# Patient Record
Sex: Male | Born: 1960 | Race: Black or African American | Hispanic: No | Marital: Married | Smoking: Current some day smoker
Health system: Southern US, Community
[De-identification: ages and names within clinical notes are randomized; demographics above are authoritative.]

## PROBLEM LIST (undated history)

## (undated) DIAGNOSIS — K219 Gastro-esophageal reflux disease without esophagitis: Secondary | ICD-10-CM

## (undated) DIAGNOSIS — R0600 Dyspnea, unspecified: Secondary | ICD-10-CM

## (undated) DIAGNOSIS — M109 Gout, unspecified: Secondary | ICD-10-CM

## (undated) DIAGNOSIS — E785 Hyperlipidemia, unspecified: Secondary | ICD-10-CM

## (undated) DIAGNOSIS — G629 Polyneuropathy, unspecified: Secondary | ICD-10-CM

## (undated) DIAGNOSIS — M1990 Unspecified osteoarthritis, unspecified site: Secondary | ICD-10-CM

## (undated) DIAGNOSIS — I10 Essential (primary) hypertension: Secondary | ICD-10-CM

## (undated) DIAGNOSIS — E119 Type 2 diabetes mellitus without complications: Secondary | ICD-10-CM

## (undated) DIAGNOSIS — R0989 Other specified symptoms and signs involving the circulatory and respiratory systems: Secondary | ICD-10-CM

## (undated) HISTORY — PX: NO PAST SURGERIES: SHX2092

## (undated) HISTORY — DX: Type 2 diabetes mellitus without complications: E11.9

## (undated) HISTORY — PX: COLONOSCOPY WITH PROPOFOL: SHX5780

## (undated) HISTORY — DX: Hyperlipidemia, unspecified: E78.5

## (undated) HISTORY — DX: Essential (primary) hypertension: I10

---

## 2003-12-05 ENCOUNTER — Other Ambulatory Visit: Payer: Self-pay

## 2005-02-18 ENCOUNTER — Emergency Department: Payer: Self-pay | Admitting: Emergency Medicine

## 2005-04-16 ENCOUNTER — Emergency Department: Payer: Self-pay | Admitting: Emergency Medicine

## 2005-07-09 ENCOUNTER — Emergency Department: Payer: Self-pay | Admitting: Emergency Medicine

## 2005-08-17 ENCOUNTER — Ambulatory Visit: Payer: Self-pay

## 2006-01-15 ENCOUNTER — Emergency Department: Payer: Self-pay | Admitting: Emergency Medicine

## 2006-04-29 ENCOUNTER — Emergency Department: Payer: Self-pay | Admitting: Emergency Medicine

## 2007-08-17 ENCOUNTER — Emergency Department: Payer: Self-pay | Admitting: Emergency Medicine

## 2007-10-18 ENCOUNTER — Other Ambulatory Visit: Payer: Self-pay

## 2007-10-18 ENCOUNTER — Emergency Department: Payer: Self-pay | Admitting: Emergency Medicine

## 2007-10-27 ENCOUNTER — Emergency Department: Payer: Self-pay | Admitting: Emergency Medicine

## 2008-06-01 ENCOUNTER — Emergency Department: Payer: Self-pay | Admitting: Internal Medicine

## 2008-06-26 ENCOUNTER — Emergency Department: Payer: Self-pay | Admitting: Emergency Medicine

## 2008-07-03 ENCOUNTER — Emergency Department: Payer: Self-pay | Admitting: Emergency Medicine

## 2008-12-13 ENCOUNTER — Emergency Department: Payer: Self-pay | Admitting: Unknown Physician Specialty

## 2009-01-31 ENCOUNTER — Emergency Department: Payer: Self-pay | Admitting: Internal Medicine

## 2009-02-04 ENCOUNTER — Encounter: Payer: Self-pay | Admitting: Physician Assistant

## 2009-02-12 ENCOUNTER — Encounter: Payer: Self-pay | Admitting: Physician Assistant

## 2009-11-05 ENCOUNTER — Ambulatory Visit: Payer: Self-pay | Admitting: Family Medicine

## 2009-11-11 ENCOUNTER — Emergency Department: Payer: Self-pay | Admitting: Emergency Medicine

## 2009-12-15 ENCOUNTER — Emergency Department: Payer: Self-pay | Admitting: Internal Medicine

## 2010-01-27 ENCOUNTER — Emergency Department: Payer: Self-pay | Admitting: Emergency Medicine

## 2010-03-16 ENCOUNTER — Emergency Department: Payer: Self-pay | Admitting: Emergency Medicine

## 2010-05-09 ENCOUNTER — Emergency Department: Payer: Self-pay | Admitting: Emergency Medicine

## 2011-06-08 ENCOUNTER — Emergency Department: Payer: Self-pay | Admitting: *Deleted

## 2011-06-08 LAB — COMPREHENSIVE METABOLIC PANEL
Albumin: 3.8 g/dL (ref 3.4–5.0)
Alkaline Phosphatase: 52 U/L (ref 50–136)
Anion Gap: 12 (ref 7–16)
BUN: 21 mg/dL — ABNORMAL HIGH (ref 7–18)
Glucose: 120 mg/dL — ABNORMAL HIGH (ref 65–99)
Potassium: 4.1 mmol/L (ref 3.5–5.1)
SGOT(AST): 53 U/L — ABNORMAL HIGH (ref 15–37)
SGPT (ALT): 74 U/L
Total Protein: 7.1 g/dL (ref 6.4–8.2)

## 2011-06-08 LAB — CBC WITH DIFFERENTIAL/PLATELET
Basophil %: 0.2 %
Eosinophil %: 0.6 %
HCT: 48.4 % (ref 40.0–52.0)
HGB: 16.6 g/dL (ref 13.0–18.0)
Lymphocyte #: 2.2 10*3/uL (ref 1.0–3.6)
MCH: 33.5 pg (ref 26.0–34.0)
MCV: 98 fL (ref 80–100)
Monocyte #: 1.1 10*3/uL — ABNORMAL HIGH (ref 0.0–0.7)
Monocyte %: 15.5 %
Neutrophil #: 3.6 10*3/uL (ref 1.4–6.5)
Platelet: 184 10*3/uL (ref 150–440)
RBC: 4.95 10*6/uL (ref 4.40–5.90)
WBC: 6.9 10*3/uL (ref 3.8–10.6)

## 2011-06-08 LAB — PROTIME-INR
INR: 0.9
Prothrombin Time: 12.9 secs (ref 11.5–14.7)

## 2011-06-08 LAB — TROPONIN I: Troponin-I: 0.02 ng/mL

## 2011-06-20 ENCOUNTER — Ambulatory Visit: Payer: Self-pay | Admitting: Family Medicine

## 2012-08-22 ENCOUNTER — Emergency Department: Payer: Self-pay | Admitting: Emergency Medicine

## 2012-08-22 LAB — DRUG SCREEN, URINE
Benzodiazepine, Ur Scrn: NEGATIVE (ref ?–200)
Cannabinoid 50 Ng, Ur ~~LOC~~: NEGATIVE (ref ?–50)
Cocaine Metabolite,Ur ~~LOC~~: POSITIVE (ref ?–300)
MDMA (Ecstasy)Ur Screen: NEGATIVE (ref ?–500)
Tricyclic, Ur Screen: NEGATIVE (ref ?–1000)

## 2012-08-22 LAB — CK TOTAL AND CKMB (NOT AT ARMC)
CK, Total: 922 U/L — ABNORMAL HIGH (ref 35–232)
CK-MB: 3.1 ng/mL (ref 0.5–3.6)

## 2012-08-22 LAB — CBC
HGB: 16.9 g/dL (ref 13.0–18.0)
MCH: 31.4 pg (ref 26.0–34.0)
MCHC: 33.8 g/dL (ref 32.0–36.0)
Platelet: 226 10*3/uL (ref 150–440)
RBC: 5.38 10*6/uL (ref 4.40–5.90)
RDW: 13.4 % (ref 11.5–14.5)
WBC: 10.2 10*3/uL (ref 3.8–10.6)

## 2012-08-22 LAB — ETHANOL
Ethanol %: 0.102 % — ABNORMAL HIGH (ref 0.000–0.080)
Ethanol: 102 mg/dL

## 2012-08-22 LAB — COMPREHENSIVE METABOLIC PANEL
Albumin: 4 g/dL (ref 3.4–5.0)
Alkaline Phosphatase: 68 U/L (ref 50–136)
BUN: 11 mg/dL (ref 7–18)
Bilirubin,Total: 0.4 mg/dL (ref 0.2–1.0)
Co2: 20 mmol/L — ABNORMAL LOW (ref 21–32)
Creatinine: 0.59 mg/dL — ABNORMAL LOW (ref 0.60–1.30)
EGFR (African American): >60
EGFR (Non-African Amer.): >60
Glucose: 97 mg/dL (ref 65–99)
SGOT(AST): 39 U/L — ABNORMAL HIGH (ref 15–37)
SGPT (ALT): 41 U/L (ref 12–78)
Sodium: 141 mmol/L (ref 136–145)
Total Protein: 7.6 g/dL (ref 6.4–8.2)

## 2012-08-22 LAB — LIPASE, BLOOD: Lipase: 192 U/L (ref 73–393)

## 2012-08-23 LAB — TROPONIN I: Troponin-I: <0.02 ng/mL

## 2012-11-09 ENCOUNTER — Emergency Department: Payer: Self-pay | Admitting: Internal Medicine

## 2013-10-01 ENCOUNTER — Ambulatory Visit (INDEPENDENT_AMBULATORY_CARE_PROVIDER_SITE_OTHER): Payer: PRIVATE HEALTH INSURANCE

## 2013-10-01 ENCOUNTER — Encounter: Payer: Self-pay | Admitting: Podiatry

## 2013-10-01 ENCOUNTER — Ambulatory Visit (INDEPENDENT_AMBULATORY_CARE_PROVIDER_SITE_OTHER): Payer: PRIVATE HEALTH INSURANCE | Admitting: Podiatry

## 2013-10-01 VITALS — BP 126/82 | HR 75 | Resp 16 | Ht 66.0 in | Wt 177.0 lb

## 2013-10-01 DIAGNOSIS — M778 Other enthesopathies, not elsewhere classified: Secondary | ICD-10-CM

## 2013-10-01 DIAGNOSIS — M79673 Pain in unspecified foot: Secondary | ICD-10-CM

## 2013-10-01 DIAGNOSIS — E1049 Type 1 diabetes mellitus with other diabetic neurological complication: Secondary | ICD-10-CM

## 2013-10-01 DIAGNOSIS — M7750 Other enthesopathy of unspecified foot: Secondary | ICD-10-CM

## 2013-10-01 DIAGNOSIS — M779 Enthesopathy, unspecified: Secondary | ICD-10-CM

## 2013-10-01 DIAGNOSIS — B351 Tinea unguium: Secondary | ICD-10-CM

## 2013-10-01 DIAGNOSIS — M79609 Pain in unspecified limb: Secondary | ICD-10-CM

## 2013-10-01 DIAGNOSIS — M205X9 Other deformities of toe(s) (acquired), unspecified foot: Secondary | ICD-10-CM

## 2013-10-01 DIAGNOSIS — M79603 Pain in arm, unspecified: Secondary | ICD-10-CM

## 2013-10-01 NOTE — Progress Notes (Signed)
   Subjective:    Patient ID: Dennis Rowland, male    DOB: [birth year], 53 y.o.   MRN: [id number]  HPI Comments: Both of my big toes swell. They have been swelling for the last 2.5 months. They hurt as well. They are getting worse. i cut my callus and corns off and wear good shoes.  Foot Pain Associated symptoms include abdominal pain, chest pain, chills, headaches, numbness and weakness.      Review of Systems  Constitutional: Positive for chills and unexpected weight change.  HENT: Positive for sneezing.        Sinus problems  Eyes: Positive for visual disturbance.  Respiratory: Positive for shortness of breath.   Cardiovascular: Positive for chest pain.       Calf pain when walking   Gastrointestinal: Positive for abdominal pain, diarrhea and constipation.  Endocrine: Positive for cold intolerance and heat intolerance.       Excessive thirst  Musculoskeletal:       Joint pain Back pain Difficulty walking Muscle pain  Skin: Positive for color change.  Neurological: Positive for weakness, light-headedness, numbness and headaches.  Hematological:       Slow to heal  All other systems reviewed and are negative.      Objective:   Physical Exam: I have reviewed his past history medications allergies surgeries social history and review of systems. Pulses are strongly palpable bilateral capillary fill time to digits is immediate. Neurologic sensorium is decreased per since once the monofilament to the level of the metatarsophalangeal joints bilaterally. Deep tendon reflexes are in non-elicitable. Muscle strength is normal with relatively normal osseous architecture. Orthopedic evaluation does demonstrate pain on end range of motion of the first metatarsophalangeal joint left greater than right. 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 Spurring is noted hallux left. Cutaneous evaluation demonstrates supple well  hydrated cutis no erythema edema cellulitis drainage or odor. Dry xerotic skin to the plantar aspect of the bilateral foot nails appear to be thick yellow dystrophic and clinically mycotic.        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 Pain in limb secondary to onychomycosis bilateral. Diabetic peripheral neuropathy bilateral.  Plan: After sterile Betadine skin prep I injected 2 mg of dexamethasone into the first metatarsophalangeal joint of the left foot. I debrided his nails 1 through 5 bilateral. I will followup with him in 3 months.

## 2013-10-14 ENCOUNTER — Emergency Department: Payer: Self-pay | Admitting: Emergency Medicine

## 2013-11-23 ENCOUNTER — Emergency Department: Payer: Self-pay | Admitting: Emergency Medicine

## 2013-11-23 LAB — DRUG SCREEN, URINE
Amphetamines, Ur Screen: NEGATIVE (ref ?–1000)
BENZODIAZEPINE, UR SCRN: NEGATIVE (ref ?–200)
Barbiturates, Ur Screen: NEGATIVE (ref ?–200)
CANNABINOID 50 NG, UR ~~LOC~~: NEGATIVE (ref ?–50)
Cocaine Metabolite,Ur ~~LOC~~: POSITIVE (ref ?–300)
MDMA (Ecstasy)Ur Screen: NEGATIVE (ref ?–500)
Methadone, Ur Screen: NEGATIVE (ref ?–300)
OPIATE, UR SCREEN: NEGATIVE (ref ?–300)
Phencyclidine (PCP) Ur S: NEGATIVE (ref ?–25)
Tricyclic, Ur Screen: NEGATIVE (ref ?–1000)

## 2013-11-23 LAB — URINALYSIS, COMPLETE
BACTERIA: NONE SEEN
BLOOD: NEGATIVE
Bilirubin,UR: NEGATIVE
Glucose,UR: NEGATIVE mg/dL (ref 0–75)
Ketone: NEGATIVE
Leukocyte Esterase: NEGATIVE
Nitrite: NEGATIVE
PH: 5 (ref 4.5–8.0)
Protein: NEGATIVE
RBC,UR: <1 /HPF (ref 0–5)
SPECIFIC GRAVITY: 1.006 (ref 1.003–1.030)
WBC UR: 1 /HPF (ref 0–5)

## 2013-11-23 LAB — COMPREHENSIVE METABOLIC PANEL
AST: 51 U/L — AB (ref 15–37)
Albumin: 4 g/dL (ref 3.4–5.0)
Alkaline Phosphatase: 69 U/L
Anion Gap: 11 (ref 7–16)
BILIRUBIN TOTAL: 0.8 mg/dL (ref 0.2–1.0)
BUN: 15 mg/dL (ref 7–18)
CALCIUM: 8.3 mg/dL — AB (ref 8.5–10.1)
CHLORIDE: 108 mmol/L — AB (ref 98–107)
CO2: 21 mmol/L (ref 21–32)
Creatinine: 1.03 mg/dL (ref 0.60–1.30)
EGFR (Non-African Amer.): >60
Glucose: 166 mg/dL — ABNORMAL HIGH (ref 65–99)
OSMOLALITY: 284 (ref 275–301)
POTASSIUM: 3.9 mmol/L (ref 3.5–5.1)
SGPT (ALT): 78 U/L (ref 12–78)
SODIUM: 140 mmol/L (ref 136–145)
Total Protein: 8 g/dL (ref 6.4–8.2)

## 2013-11-23 LAB — CBC
HCT: 50.6 % (ref 40.0–52.0)
HGB: 17.5 g/dL (ref 13.0–18.0)
MCH: 33.7 pg (ref 26.0–34.0)
MCHC: 34.6 g/dL (ref 32.0–36.0)
MCV: 98 fL (ref 80–100)
Platelet: 178 10*3/uL (ref 150–440)
RBC: 5.19 10*6/uL (ref 4.40–5.90)
RDW: 13.2 % (ref 11.5–14.5)
WBC: 6.6 10*3/uL (ref 3.8–10.6)

## 2013-11-23 LAB — ETHANOL
ETHANOL LVL: 296 mg/dL
Ethanol %: 0.296 % — ABNORMAL HIGH (ref 0.000–0.080)

## 2013-11-23 LAB — SALICYLATE LEVEL: Salicylates, Serum: 3.6 mg/dL — ABNORMAL HIGH

## 2013-11-23 LAB — ACETAMINOPHEN LEVEL: Acetaminophen: <2 ug/mL

## 2013-12-12 ENCOUNTER — Ambulatory Visit: Payer: PRIVATE HEALTH INSURANCE | Admitting: Podiatry

## 2013-12-31 ENCOUNTER — Ambulatory Visit: Payer: PRIVATE HEALTH INSURANCE | Admitting: Podiatry

## 2014-09-05 NOTE — Consult Note (Signed)
PATIENT NAME:  Dennis Rowland, Dennis Rowland MR#:  [id number] DATE OF BIRTH:  11/14/1960  DATE OF CONSULTATION:  11/23/2013  REFERRING PHYSICIAN:   CONSULTING PHYSICIAN:  Gonzella Lex, MD  IDENTIFYING INFORMATION AND REASON FOR CONSULT: A 54 year old man petitioned by his wife for violent behavior while intoxicated. Consultation for psychiatric evaluation.   HISTORY OF PRESENT ILLNESS: Information obtained from the patient, from the chart and from speaking with his wife. Wife reports that he went out and got drunk last night, came home violent and aggressive with smashing bottles around the house, out of control, would not stop when she talked with him. Law had to be called eventually and they said that she had petitioned him to bring him into the hospital. The patient says that he was just drinking and maybe he was a little irritable but he will not elaborate on any of his behavior. Admits that he had had some alcohol last night and admits he had had some cocaine. Says that this is not a regular thing, does not really see it as being a problem. Wife says that it has been quite a while since he last got drunk to the point that he was dangerous but he still drinks on almost daily basis. She thinks that he has some kind of "mental problem" because he is memory is poor, but she does not know anything more about it than that and he is not getting any psychiatric treatment.   PAST PSYCHIATRIC HISTORY: She thinks that he had had hospitalizations years ago as a teenager. He is not getting any psychiatric treatment for anything now and is not on any psychiatric medicine. Denies any history of suicidal or homicidal history.   FAMILY HISTORY: Not identified.   SOCIAL HISTORY: Lives with his wife. 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   PAST MEDICAL HISTORY: Has diabetes, also has gout.   CURRENT MEDICATIONS: Insulin Levemir 33 units at bedtime, also metformin 500 mg twice a  day and colchicine 0.6 mg once a day.   ALLERGIES: IV DYE, BEE STINGS.   REVIEW OF SYSTEMS: The patient has no current complaints, full review of systems negative.   MENTAL STATUS EXAMINATION: He is awake and alert, passively interactive. Eye contact minimal. Psychomotor activity minimal. Speech decreased in total amount. Affect flat, a little bit grumpy but not hostile. Mood stated as being ready to go. Thoughts are not bizarre. No obvious delusions but decreased in total amount. No hallucinations evident. Denies suicidal or homicidal ideation. Judgment and insight reasonably impaired. Short-term memory impaired about his substance abuse.   VITAL SIGNS: Temperature 97.5, pulse 79, respirations 16, blood pressure 113/92.   LABORATORY, DIAGNOSTIC AND RADIOLOGICAL DATA:  Drug screen is positive for cocaine. Chemistry showed glucose elevated at 166, chloride elevated at 108, AST elevated at 51. Alcohol level 296 when he came in this morning. CBC was all normal. Urinalysis unremarkable. Elevated salicylates, not toxic though at 30.6.   ASSESSMENT:  This is a 54 year old man who has ongoing problem with drinking and got drunk to the point of being violent and aggressive and threatening. He was still somewhat hostile when he was interviewed earlier today but now has started to calm down. It is not clear that he needs inpatient hospitalization but still appears to be too intoxicated to go home.   TREATMENT PLAN: Detox orders in place. Continue medical medications. Re-evaluate tomorrow. If the patient has calmed down and is not aggressive  or actively psychotic, we will discuss substance abuse treatment with him.   DIAGNOSIS, PRINCIPAL AND PRIMARY:  AXIS I: Substance-induced mood disorder.   SECONDARY DIAGNOSES: AXIS I: Alcohol dependence.  AXIS II: Deferred.  AXIS III: Diabetes, gout.  AXIS IV: Moderate chronic stress from disability.  AXIS V: Functioning at time of evaluation is 54.     ____________________________ Gonzella Lex, MD jtc:cs D: 11/23/2013 17:11:55 ET T: 11/23/2013 18:03:15 ET JOB#: [id number]  cc: Gonzella Lex, MD, <Dictator> Gonzella Lex MD ELECTRONICALLY SIGNED 11/30/2013 12:40

## 2014-09-05 NOTE — Consult Note (Signed)
Psychiatry: Follow-up note for this patient who presented highly intoxicated and belligerent last night.  Today he is sober and has been cooperative and pleasant.  Not showing any signs of withdrawal.  Shows good insight into the problems associated with binge drinking.  Denies suicidal or homicidal ideation.  Wife is agreeable to having him come home.  Both agree that this is a fairly infrequent occurrence. mental status he is alert and oriented and cooperative.  Affect slightly blunted mood fine.  Thoughts lucid no loosening of associations no delusions.  Alert and oriented 4.  Judgment and insight improved.  Normal intelligence and short and long-term memory intact. and treatment plan: Alcohol abuse.  Cocaine abuse.  Of patient will be discharged home to follow up with his usual outpatient care.  Counseling and therapy done.  Emergency room physician agreeable.  Electronic Signatures: Gonzella Lex (MD)  (Signed on 13-Jul-15 22:27)  Authored  Last Updated: 13-Jul-15 22:27 by Gonzella Lex (MD)

## 2015-04-15 ENCOUNTER — Emergency Department
Admission: EM | Admit: 2015-04-15 | Discharge: 2015-04-15 | Disposition: A | Discharge status: Home / Self Care | Payer: Medicare Other | Attending: Emergency Medicine | Admitting: Emergency Medicine

## 2015-04-15 ENCOUNTER — Encounter: Payer: Self-pay | Admitting: *Deleted

## 2015-04-15 DIAGNOSIS — Y9389 Activity, other specified: Secondary | ICD-10-CM | POA: Insufficient documentation

## 2015-04-15 DIAGNOSIS — Z79899 Other long term (current) drug therapy: Secondary | ICD-10-CM | POA: Diagnosis not present

## 2015-04-15 DIAGNOSIS — F17200 Nicotine dependence, unspecified, uncomplicated: Secondary | ICD-10-CM | POA: Diagnosis not present

## 2015-04-15 DIAGNOSIS — Y9289 Other specified places as the place of occurrence of the external cause: Secondary | ICD-10-CM | POA: Diagnosis not present

## 2015-04-15 DIAGNOSIS — Z7984 Long term (current) use of oral hypoglycemic drugs: Secondary | ICD-10-CM | POA: Insufficient documentation

## 2015-04-15 DIAGNOSIS — E119 Type 2 diabetes mellitus without complications: Secondary | ICD-10-CM | POA: Insufficient documentation

## 2015-04-15 DIAGNOSIS — S3121XA Laceration without foreign body of penis, initial encounter: Secondary | ICD-10-CM | POA: Diagnosis not present

## 2015-04-15 DIAGNOSIS — Y998 Other external cause status: Secondary | ICD-10-CM | POA: Insufficient documentation

## 2015-04-15 DIAGNOSIS — Z791 Long term (current) use of non-steroidal anti-inflammatories (NSAID): Secondary | ICD-10-CM | POA: Diagnosis not present

## 2015-04-15 DIAGNOSIS — S3994XA Unspecified injury of external genitals, initial encounter: Secondary | ICD-10-CM | POA: Diagnosis present

## 2015-04-15 DIAGNOSIS — X58XXXA Exposure to other specified factors, initial encounter: Secondary | ICD-10-CM | POA: Diagnosis not present

## 2015-04-15 MED ORDER — MUPIROCIN 2 % EX OINT: TOPICAL_OINTMENT | CUTANEOUS | Status: AC

## 2015-04-15 NOTE — ED Notes (Signed)
Not circumsied , has tear on foreskin past week

## 2015-04-15 NOTE — ED Provider Notes (Signed)
[facility] Emergency Department Provider Note  ____________________________________________  Time seen: Approximately 11:55 AM  I have reviewed the triage vital signs and the nursing notes.   HISTORY  Chief Complaint Penis Injury    HPI Dennis Rowland. is a 54 y.o. male who presents for evaluation of a minor superficial tear of his foreskin times one week. Patient unsure of etiology is nose is a skin is been tight lately and started to tear. Denies any pain associated with this. No known trauma. Denies any penile discharge at this time.  Past Medical History  Diagnosis Date  . Diabetes ([place])     There are no active problems to display for this patient.   History reviewed. No pertinent past surgical history.  Current Outpatient Rx  Name  Route  Sig  Dispense  Refill  . ACCU-CHEK SOFTCLIX LANCETS lancets               . colchicine 0.6 MG tablet               . indomethacin (INDOCIN) 50 MG capsule               . LEVEMIR FLEXTOUCH 100 UNIT/ML Pen               . meloxicam (MOBIC) 15 MG tablet               . metFORMIN (GLUCOPHAGE) 500 MG tablet               . mupirocin ointment (BACTROBAN) 2 %      Apply to affected area 3 times daily   22 g   0   . pantoprazole (PROTONIX) 40 MG tablet               . traMADol (ULTRAM) 50 MG tablet                 Allergies Dye fdc red  No family history on file.  Social History Social History  Substance Use Topics  . Smoking status: Current Every Day Smoker  . Smokeless tobacco: None  . Alcohol Use: Yes    Review of Systems Constitutional: No fever/chills Eyes: No visual changes. ENT: No sore throat. Cardiovascular: Denies chest pain. Respiratory: Denies shortness of breath. Gastrointestinal: No abdominal pain.  No nausea, no vomiting.  No diarrhea.  No constipation. Genitourinary: Positive for superficial lacerations to the foreskin and  penis. Musculoskeletal: Negative for back pain. Skin: Negative for rash. Neurological: Negative for headaches, focal weakness or numbness.  10-point ROS otherwise negative.  ____________________________________________   PHYSICAL EXAM:  VITAL SIGNS: ED Triage Vitals  Enc Vitals Group     BP 04/15/15 1117 133/77 mmHg     Pulse Rate 04/15/15 1117 98     Resp 04/15/15 1117 20     Temp 04/15/15 1117 98.3 F (36.8 C)     Temp Source 04/15/15 1117 Oral     SpO2 04/15/15 1117 98 %     Weight 04/15/15 1117 165 lb (74.844 kg)     Height 04/15/15 1117 5\' 6"  (1.676 m)     Head Cir --      Peak Flow --      Pain Score 04/15/15 1118 0     Pain Loc --      Pain Edu? --      Excl. in [place]? --     Constitutional: Alert and oriented. Well appearing and in no acute  distress. Cardiovascular: Normal rate, regular rhythm. Grossly normal heart sounds.  Good peripheral circulation. Respiratory: Normal respiratory effort.  No retractions. Lungs CTAB. Genitourinary: Superficial lacerations approximately 2 mm on foreskin and dorsal aspect of the penis. Uncircumcised male. Neurologic:  Normal speech and language. No gross focal neurologic deficits are appreciated. No gait instability. Skin:  Skin is warm, dry and intact. No rash noted. Psychiatric: Mood and affect are normal. Speech and behavior are normal.  ____________________________________________   LABS (all labs ordered are listed, but only abnormal results are displayed)  Labs Reviewed - No data to display ____________________________________________   PROCEDURES  Procedure(s) performed: None  Critical Care performed: No  ____________________________________________   INITIAL IMPRESSION / ASSESSMENT AND PLAN / ED COURSE  Pertinent labs & imaging results that were available during my care of the patient were reviewed by me and considered in my medical decision making (see chart for details).  Superficial foreskin penile  lacerations. Rx given for mupirocin ointment 3 times a day patient to follow up with urology on-call. Patient voices no other emergency medical complaints at this time. ____________________________________________   FINAL CLINICAL IMPRESSION(S) / ED DIAGNOSES  Final diagnoses:  Penile laceration, initial encounter      Arlyss Repress, PA-C 04/15/15 1206  Wandra Arthurs, MD 04/15/15 [phone]

## 2015-04-15 NOTE — Discharge Instructions (Signed)
Wound Care °Taking care of your wound properly can help to prevent pain and infection. It can also help your wound to heal more quickly.  °HOW TO CARE FOR YOUR WOUND  °· Take or apply over-the-counter and prescription medicines only as told by your health care provider. °· If you were prescribed antibiotic medicine, take or apply it as told by your health care provider. Do not stop using the antibiotic even if your condition improves. °· Clean the wound each day or as told by your health care provider. °¨ Wash the wound with mild soap and water. °¨ Rinse the wound with water to remove all soap. °¨ Pat the wound dry with a clean towel. Do not rub it. °· There are many different ways to close and cover a wound. For example, a wound can be covered with stitches (sutures), skin glue, or adhesive strips. Follow instructions from your health care provider about: °¨ How to take care of your wound. °¨ When and how you should change your bandage (dressing). °¨ When you should remove your dressing. °¨ Removing whatever was used to close your wound. °· Check your wound every day for signs of infection. Watch for: °¨ Redness, swelling, or pain. °¨ Fluid, blood, or pus. °· Keep the dressing dry until your health care provider says it can be removed. Do not take baths, swim, use a hot tub, or do anything that would put your wound underwater until your health care provider approves. °· Raise (elevate) the injured area above the level of your heart while you are sitting or lying down. °· Do not scratch or pick at the wound. °· Keep all follow-up visits as told by your health care provider. This is important. °SEEK MEDICAL CARE IF: °· You received a tetanus shot and you have swelling, severe pain, redness, or bleeding at the injection site. °· You have a fever. °· Your pain is not controlled with medicine. °· You have increased redness, swelling, or pain at the site of your wound. °· You have fluid, blood, or pus coming from your  wound. °· You notice a bad smell coming from your wound or your dressing. °SEEK IMMEDIATE MEDICAL CARE IF: °· You have a red streak going away from your wound. °  °This information is not intended to replace advice given to you by your health care provider. Make sure you discuss any questions you have with your health care provider. °  °Document Released: 02/08/2008 Document Revised: 09/15/2014 Document Reviewed: 04/27/2014 °Elsevier Interactive Patient Education ©2016 [employer] ° °

## 2017-12-10 ENCOUNTER — Ambulatory Visit: Payer: Medicaid Other

## 2017-12-10 ENCOUNTER — Encounter: Payer: Self-pay | Admitting: Podiatry

## 2017-12-10 ENCOUNTER — Encounter

## 2017-12-10 ENCOUNTER — Ambulatory Visit (INDEPENDENT_AMBULATORY_CARE_PROVIDER_SITE_OTHER): Payer: 59 | Admitting: Podiatry

## 2017-12-10 VITALS — BP 119/76 | HR 82 | Resp 16

## 2017-12-10 DIAGNOSIS — M79676 Pain in unspecified toe(s): Secondary | ICD-10-CM

## 2017-12-10 DIAGNOSIS — E1142 Type 2 diabetes mellitus with diabetic polyneuropathy: Secondary | ICD-10-CM | POA: Diagnosis not present

## 2017-12-10 DIAGNOSIS — G5791 Unspecified mononeuropathy of right lower limb: Secondary | ICD-10-CM | POA: Diagnosis not present

## 2017-12-10 DIAGNOSIS — M2042 Other hammer toe(s) (acquired), left foot: Secondary | ICD-10-CM

## 2017-12-10 DIAGNOSIS — Q828 Other specified congenital malformations of skin: Secondary | ICD-10-CM | POA: Diagnosis not present

## 2017-12-10 DIAGNOSIS — B351 Tinea unguium: Secondary | ICD-10-CM

## 2017-12-10 DIAGNOSIS — M2041 Other hammer toe(s) (acquired), right foot: Secondary | ICD-10-CM

## 2017-12-10 NOTE — Progress Notes (Signed)
He presents today chief complaint of painful radiating pain from the plantar medial aspect of the first metatarsophalangeal joint of the right foot radiating up into the toe.  Is also complaining of painful elongated toenails numbness and tingling in his feet as well as corns and calluses bilaterally.  Objective: Vital signs are stable he is alert and oriented x3.  Pulses are palpable has digital deformities consisting of bunion deformities hammertoe deformities as well as painful neuritis on palpation.  Toenails are long thick yellow dystrophic onychomycotic pre-ulcerative calluses plantar aspect of the forefoot bilaterally.  Was debrided does not demonstrate any type of open lesion or wound.  Assessment: Pain in limb secondary to diabetes diabetic peripheral neuropathy digital deformities pain in limb secondary to onychomycosis neuritis and porokeratosis.  Plan: After sterile Betadine skin prep I injected 2 mg of dexamethasone local anesthetic plantar medial aspect of the first metatarsophalangeal joint of the right foot debrided all reactive hyperkeratotic lesions to toenails 1 through 5 bilateral.  We will also start paperwork for diabetic shoes.

## 2017-12-19 ENCOUNTER — Ambulatory Visit: Payer: 59 | Admitting: Orthotics

## 2017-12-19 DIAGNOSIS — E1142 Type 2 diabetes mellitus with diabetic polyneuropathy: Secondary | ICD-10-CM

## 2017-12-19 NOTE — Progress Notes (Signed)
Patient being seen by NP at [facility].  Told him to come back after he is seen by a MD/DO.

## 2018-02-15 ENCOUNTER — Other Ambulatory Visit: Payer: Self-pay | Admitting: Nurse Practitioner

## 2018-02-15 DIAGNOSIS — R1909 Other intra-abdominal and pelvic swelling, mass and lump: Secondary | ICD-10-CM

## 2018-02-26 ENCOUNTER — Ambulatory Visit: Payer: 59

## 2018-03-05 ENCOUNTER — Encounter: Payer: Self-pay | Admitting: Nurse Practitioner

## 2018-03-05 ENCOUNTER — Ambulatory Visit
Admission: RE | Admit: 2018-03-05 | Discharge: 2018-03-05 | Disposition: A | Discharge status: Home / Self Care | Payer: 59 | Source: Ambulatory Visit | Attending: Nurse Practitioner | Admitting: Nurse Practitioner

## 2018-03-05 DIAGNOSIS — R1909 Other intra-abdominal and pelvic swelling, mass and lump: Secondary | ICD-10-CM | POA: Insufficient documentation

## 2018-03-05 DIAGNOSIS — K4090 Unilateral inguinal hernia, without obstruction or gangrene, not specified as recurrent: Secondary | ICD-10-CM | POA: Diagnosis not present

## 2018-03-06 ENCOUNTER — Ambulatory Visit: Payer: 59 | Admitting: Orthotics

## 2018-03-06 DIAGNOSIS — E1142 Type 2 diabetes mellitus with diabetic polyneuropathy: Secondary | ICD-10-CM

## 2018-03-06 DIAGNOSIS — Q828 Other specified congenital malformations of skin: Secondary | ICD-10-CM

## 2018-03-06 DIAGNOSIS — G5791 Unspecified mononeuropathy of right lower limb: Secondary | ICD-10-CM

## 2018-03-06 NOTE — Progress Notes (Signed)

## 2018-03-25 ENCOUNTER — Ambulatory Visit (INDEPENDENT_AMBULATORY_CARE_PROVIDER_SITE_OTHER): Payer: 59 | Admitting: Podiatry

## 2018-03-25 ENCOUNTER — Encounter

## 2018-03-25 ENCOUNTER — Encounter: Payer: Self-pay | Admitting: Podiatry

## 2018-03-25 DIAGNOSIS — Q828 Other specified congenital malformations of skin: Secondary | ICD-10-CM

## 2018-03-25 DIAGNOSIS — M79676 Pain in unspecified toe(s): Secondary | ICD-10-CM

## 2018-03-25 DIAGNOSIS — B351 Tinea unguium: Secondary | ICD-10-CM

## 2018-03-25 DIAGNOSIS — E1142 Type 2 diabetes mellitus with diabetic polyneuropathy: Secondary | ICD-10-CM

## 2018-03-25 DIAGNOSIS — M205X1 Other deformities of toe(s) (acquired), right foot: Secondary | ICD-10-CM

## 2018-03-25 DIAGNOSIS — L84 Corns and callosities: Secondary | ICD-10-CM | POA: Diagnosis not present

## 2018-03-25 MED ORDER — MELOXICAM 15 MG PO TABS: 15.0000 mg | ORAL_TABLET | Freq: Every day | ORAL | 1 refills | Status: DC

## 2018-03-25 NOTE — Progress Notes (Signed)
This patient presents to the office for continued treatment of his long thick nails and his painful calluses.  Patient states that the nails are painful walking and wearing his shoes.  He also says the callus under the outside  ball of his right foot is painful walking.  He states he is also having severe pain in his big toe joint right foot that he is unable to walk with pressure on his right foot.  Review of his history does reveal he has a history of arthritis for which he has been treated with colchicine and Indocin.  He has been seen years ago by Dr. 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  He states he is unable to bear weight and even touch the big toe joint today.  He also presents the office stating that he is due to receive diabetic shoes.  Finally he says he is not interested in any type of injection since the injection was so painful his last visit.  He presents the office today for preventative foot care services and evaluation of the big toe joint right foot  General Appearance  Alert, conversant and in no acute stress.  Vascular  Dorsalis pedis and posterior tibial  pulses are palpable  bilaterally.  Capillary return is within normal limits  bilaterally. Temperature is within normal limits  bilaterally.  Neurologic  Senn-Weinstein monofilament wire test within normal limits  bilaterally. Muscle power within normal limits bilaterally.  Nails Thick disfigured discolored nails with subungual debris  from hallux to fifth toes bilaterally. No evidence of bacterial infection or drainage bilaterally.  Orthopedic  No limitations of motion  feet .  No crepitus or effusions noted.  HAV deformity left foot. Severe pain upon pressure big toe joint and motion in big toe joint right foot.  Skin  Pinch callus hallux  B/L and heel callus  B/L and callus sub 5th metatarsal right foot.  No signs of infections or ulcers noted.    Onychomycosis   Callus  B/L  Debridement of nails  X  10.  Debridement of callus  B/L.  Patient was instructed to wear a surgical shoe in an effort to limit the motion in the big toe joint right foot.  He was prescribed Mobic to be taken 1 daily.  He was told to make an appointment with Dr. Milinda Pointer as he leaves today for a work-up for his severe painful big toe joint right foot.  He is to return to the office in 3 months for preventative foot care services   Gardiner Barefoot DPM

## 2018-03-28 ENCOUNTER — Other Ambulatory Visit: Payer: Self-pay

## 2018-03-28 ENCOUNTER — Ambulatory Visit: Payer: 59 | Admitting: Surgery

## 2018-03-28 ENCOUNTER — Encounter: Payer: Self-pay | Admitting: Surgery

## 2018-03-28 ENCOUNTER — Ambulatory Visit (INDEPENDENT_AMBULATORY_CARE_PROVIDER_SITE_OTHER): Payer: 59 | Admitting: Surgery

## 2018-03-28 VITALS — BP 143/83 | HR 73 | Temp 97.9°F | Resp 13 | Ht 66.0 in | Wt 150.0 lb

## 2018-03-28 DIAGNOSIS — K4020 Bilateral inguinal hernia, without obstruction or gangrene, not specified as recurrent: Secondary | ICD-10-CM | POA: Diagnosis not present

## 2018-03-28 NOTE — Progress Notes (Signed)
Surgical Clinic History and Physical  Referring provider:  Center, [facility] [place], [place] [address]  HISTORY OF PRESENT ILLNESS (HPI):  57 y.o. male presents for evaluation of painful Left groin bulge. 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 Over the past month, patient describes he has noticed a Left groin bulge with lifting or certain movements side-to-side. He says his "bulge" usually reduces spontaneously within "a few days at most", though he says he has also manually self-reduced his Left groin bulge "a few times" as well. He otherwise denies non-reducible Left groin bulge, frequent constipation, straining with urination, abdominal distention, N/V, fever/chills, CP, or SOB. He coughs frequently, attributed to chronic ongoing tobacco abuse (smoking), though says he previously quit smoking x 2 years. He denies any CP or SOB at rest or with activities. He is currently scheduled for multiple tooth extractions and has an appointment to evaluate for possible Right foot surgery, which he attributes to gout. He states he wants to have his Left inguinal hernia fixed after his upcoming dental and Right foot surgeries.  PAST MEDICAL HISTORY (PMH):  Past Medical History:  Diagnosis Date  . Diabetes ([place])   . Hyperlipidemia   . Hypertension     PAST SURGICAL HISTORY ([place]):  History reviewed. No pertinent surgical history.   MEDICATIONS:  Prior to Admission medications   Medication Sig Start Date End Date Taking? Authorizing Provider  ACCU-CHEK SOFTCLIX LANCETS lancets  07/12/13  Yes [provider]  aspirin EC 81 MG tablet 81 mg 03/02/15  Yes [provider]  cetirizine (ZYRTEC) 10 MG tablet Take 10 mg by mouth.   Yes  [provider]  colchicine 0.6 MG tablet  09/02/13  Yes [provider]  gabapentin (NEURONTIN) 300 MG capsule Take 300 mg by mouth. 12/19/12  Yes [provider]  indomethacin (INDOCIN) 50 MG capsule  07/18/13  Yes [provider]  Insulin Detemir (LEVEMIR FLEXTOUCH) 100 UNIT/ML Pen  09/16/13  Yes [provider]  lisinopril (PRINIVIL,ZESTRIL) 5 MG tablet 5 mg 03/02/15  Yes [provider]  meloxicam (MOBIC) 15 MG tablet Take 1 tablet (15 mg total) by mouth daily. 03/25/18  Yes Gardiner Barefoot, DPM  metFORMIN (GLUCOPHAGE) 500 MG tablet  08/28/13  Yes [provider]  pantoprazole (PROTONIX) 40 MG tablet  08/28/13  Yes [provider]  sildenafil (VIAGRA) 100 MG tablet 100 mg 02/19/13  Yes [provider]  simvastatin (ZOCOR) 20 MG tablet 20 mg 03/02/15  Yes [provider]  traMADol (ULTRAM) 50 MG tablet  08/04/13  Yes [provider]  chlorhexidine (PERIDEX) 0.12 % solution See admin instructions. 03/13/18   [provider]     ALLERGIES:  Allergies  Allergen Reactions  . Tape Anaphylaxis  . Dye Fdc Red [Red Dye] Nausea And Vomiting  . Hydrochlorothiazide W-Triamterene     Other reaction(s): Unknown     SOCIAL HISTORY:  Social History   Socioeconomic History  . Marital status: Married    Spouse name: Not on file  . Number of children: Not on file  . Years of education: Not on file  . Highest education level: Not on file  Occupational History  . Not on file  Social Needs  . Emergency planning/management officer  strain: Not on file  . Food insecurity:    Worry: Not on file    Inability: Not on file  . Transportation needs:    Medical: Not on file    Non-medical: Not on file  Tobacco Use  . Smoking status: Current Every Day Smoker  . Smokeless tobacco: Never Used  Substance and Sexual Activity  . Alcohol use: Yes  . Drug use: Not Currently    Types: Cocaine  . Sexual activity: Not on file   Lifestyle  . Physical activity:    Days per week: Not on file    Minutes per session: Not on file  . Stress: Not on file  Relationships  . Social connections:    Talks on phone: Not on file    Gets together: Not on file    Attends religious service: Not on file    Active member of club or organization: Not on file    Attends meetings of clubs or organizations: Not on file    Relationship status: Not on file  . Intimate partner violence:    Fear of current or ex partner: Not on file    Emotionally abused: Not on file    Physically abused: Not on file    Forced sexual activity: Not on file  Other Topics Concern  . Not on file  Social History Narrative  . Not on file    The patient currently resides (home / rehab facility / nursing home): Home The patient normally is (ambulatory / bedbound): Ambulatory  FAMILY HISTORY:  History reviewed. No pertinent family history.  Otherwise negative/non-contributory.  REVIEW OF SYSTEMS:  Constitutional: denies any other weight loss, fever, chills, or sweats  Eyes: denies any other vision changes, history of eye injury  ENT: denies sore throat, hearing problems  Respiratory: denies shortness of breath, wheezing  Cardiovascular: denies chest pain, palpitations  Gastrointestinal: abdominal pain, N/V, and bowel function as per HPI Musculoskeletal: denies any other joint pains or cramps  Skin: Denies any other rashes or skin discolorations except as per HPI Neurological: denies any other headache, dizziness, weakness  Psychiatric: Denies any other depression, anxiety   All other review of systems were otherwise negative   VITAL SIGNS:  BP (!) 143/83   Pulse 73   Temp 97.9 F (36.6 C) (Skin)   Resp 13   Ht 5\' 6"  (1.676 m)   Wt 150 lb (68 kg)   BMI 24.21 kg/m   PHYSICAL EXAM:  Constitutional:  -- Normal body habitus  -- Awake, alert, and oriented x3  Eyes:  -- Pupils equally round and reactive to light  -- No scleral icterus   Ear, nose, throat:  -- No jugular venous distension -- No nasal drainage, bleeding Pulmonary:  -- No crackles  -- Equal breath sounds bilaterally -- Breathing non-labored at rest Cardiovascular:  -- S1, S2 present  -- No pericardial rubs  Gastrointestinal:  -- Abdomen soft, nontender, non-distended, no guarding/rebound -- Left groin tender to palpation easily reducible Left inguinal hernia -- Non-tender to palpation easily reducible Right inguinal hernia -- No other abdominal masses appreciated, pulsatile or otherwise  Musculoskeletal and Integumentary:  -- Wounds or skin discoloration: Right foot with podiatric boot and dressing -- Extremities: B/L UE and LE FROM, hands and feet warm, no edema  Neurologic:  -- Motor function: Intact and symmetric -- Sensation: Intact and symmetric  Labs:  CBC Latest Ref Rng & Units 11/23/2013 08/22/2012 06/08/2011  WBC 3.8 - 10.6 x10 3/mm  3 6.6 10.2 6.9  Hemoglobin 13.0 - 18.0 g/dL 17.5 16.9 16.6  Hematocrit 40.0 - 52.0 % 50.6 49.9 48.4  Platelets 150 - 440 x10 3/mm 3 178 226 184   CMP Latest Ref Rng & Units 11/23/2013 08/22/2012 06/08/2011  Glucose 65 - 99 mg/dL 166(H) 97 120(H)  BUN 7 - 18 mg/dL 15 11 21(H)  Creatinine 0.60 - 1.30 mg/dL 1.03 0.59(L) 0.87  Sodium 136 - 145 mmol/L 140 141 142  Potassium 3.5 - 5.1 mmol/L 3.9 4.0 4.1  Chloride 98 - 107 mmol/L 108(H) 106 108(H)  CO2 21 - 32 mmol/L 21 20(L) 22  Calcium 8.5 - 10.1 mg/dL 8.3(L) 8.9 8.6  Total Protein 6.4 - 8.2 g/dL 8.0 7.6 7.1  Total Bilirubin 0.2 - 1.0 mg/dL 0.8 0.4 0.3  Alkaline Phos Unit/L 69 68 52  AST 15 - 37 Unit/L 51(H) 39(H) 53(H)  ALT 12 - 78 U/L 78 41 74   Imaging studies:  Limited Left Lower Extremity Soft Tissue Ultrasound (03/05/2018) Targeted ultrasound of the left inguinal region demonstrates a hernia containing bowel. Multiple left inguinal lymph nodes were also noted with the largest measuring 26 x 6 x 10 mm with a normal morphology including a fatty hilum.    Assessment/Plan:  57 y.o. male with Right symptomatic (painful) and Left asymptomatic easily reducible B/L inguinal hernias, complicated by co-morbidities including DM (unclear how well controlled), HTN, HLD, chronic ongoing tobacco abuse (smoking), poor dentition, medical noncompliance, and former cocaine abuse.              - discussed with patient signs and symptoms of hernia incarceration and obstruction             - strategies for manual self-reduction of patient's B/L inguinal hernias also reviewed and discussed  - maintain hydration with high fiber heart healthy diet to reduce/minimize constipation +/- daily stool softener as needed             - all risks, benefits, and alternatives to laparoscopic repair of B/L inguinal hernias with mesh were discussed with the patient, all of his questions were answered to patient's expressed satisfaction, patient expresses he wishes to proceed, and informed consent was obtained.             - will plan for laparoscopic repair of B/L inguinal hernias with mesh following his upcoming dental and Right foot surgeries  - return to clinic in 1 month following upcoming dental and Right foot surgeries for follow-up, to discuss further, and to schedule surgery  - consistent blood sugar checks and control and smoking cessation discussed and strongly encouraged  - will plan to check pre-operative HbA1C to assess DM management             - instructed to call if any questions or concerns  All of the above recommendations were discussed with the patient, and all of patient's questions were answered to his\ expressed satisfaction.  Thank you for the opportunity to participate in this patient's care.  -- Marilynne Drivers Rosana Hoes, MD, [facility]: [facility] - Partnering for exceptional care. Office: [phone]

## 2018-03-28 NOTE — Patient Instructions (Addendum)
Inguinal Hernia, Adult An inguinal hernia is when fat or the intestines push through the area where the leg meets the lower abdomen (groin) and create a rounded lump (bulge). This condition develops over time. There are three types of inguinal hernias. These types include:  Hernias that can be pushed back into the belly (are reducible).  Hernias that are not reducible (are incarcerated).  Hernias that are not reducible and lose their blood supply (are strangulated). This type of hernia requires emergency surgery.  What are the causes? This condition is caused by having a weak spot in the muscles or tissue. This weakness lets the hernia poke through. This condition can be triggered by:  Suddenly straining the muscles of the lower abdomen.  Lifting heavy objects.  Straining to have a bowel movement. Difficult bowel movements (constipation) can lead to this.  Coughing.  What increases the risk? This condition is more likely to develop in:  Men.  Pregnant women.  People who: ? Are overweight. ? Work in jobs that require long periods of standing or heavy lifting. ? Have had an inguinal hernia before. ? Smoke or have lung disease. These factors can lead to long-lasting (chronic) coughing.  What are the signs or symptoms? Symptoms can depend on the size of the hernia. Often, a small inguinal hernia has no symptoms. Symptoms of a larger hernia include:  A lump in the groin. This is easier to see when the person is standing. It might not be visible when he or she is lying down.  Pain or burning in the groin. This occurs especially when lifting, straining, or coughing.  A dull ache or a feeling of pressure in the groin.  A lump in the scrotum in men.  Symptoms of a strangulated inguinal hernia can include:  A bulge in the groin that is very painful and tender to the touch.  A bulge that turns red or purple.  Fever, nausea, and vomiting.  The inability to have a bowel  movement or to pass gas.  How is this diagnosed? This condition is diagnosed with a medical history and physical exam. Your health care provider may feel your groin area and ask you to cough. How is this treated? Treatment for this condition varies depending on the size of your hernia and whether you have symptoms. If you do not have symptoms, your health care provider may have you watch your hernia carefully and come in for follow-up visits. If your hernia is larger or if you have symptoms, your treatment will include surgery. Follow these instructions at home: Lifestyle  Drink enough fluid to keep your urine clear or pale yellow.  Eat a diet that includes a lot of fiber. Eat plenty of fruits, vegetables, and whole grains. Talk with your health care provider if you have questions.  Avoid lifting heavy objects.  Avoid standing for long periods of time.  Do not use tobacco products, including cigarettes, chewing tobacco, or e-cigarettes. If you need help quitting, ask your health care provider.  Maintain a healthy weight. General instructions  Do not try to force the hernia back in.  Watch your hernia for any changes in color or size. Let your health care provider know if any changes occur.  Take over-the-counter and prescription medicines only as told by your health care provider.  Keep all follow-up visits as told by your health care provider. This is important. Contact a health care provider if:  You have a fever.  You have new   symptoms.  Your symptoms get worse. Get help right away if:  You have pain in the groin that suddenly gets worse.  A bulge in the groin gets bigger suddenly and does not go down.  You are a man and you have a sudden pain in the scrotum, or the size of your scrotum suddenly changes.  A bulge in the groin area becomes red or purple and is painful to the touch.  You have nausea or vomiting that does not go away.  You feel your heart beating a lot  more quickly than normal.  You cannot have a bowel movement or pass gas. This information is not intended to replace advice given to you by your health care provider. Make sure you discuss any questions you have with your health care provider. Document Released: 09/17/2008 Document Revised: 10/07/2015 Document Reviewed: 03/11/2014 Elsevier Interactive Patient Education  [employer].  Return in one month.   Smoking Tobacco Information Smoking tobacco will very likely harm your health. Tobacco contains a poisonous (toxic), colorless chemical called nicotine. Nicotine affects the brain and makes tobacco addictive. This change in your brain can make it hard to stop smoking. Tobacco also has other toxic chemicals that can hurt your body and raise your risk of many cancers. How can smoking tobacco affect me? Smoking tobacco can increase your chances of having serious health conditions, such as:  Cancer. Smoking is most commonly associated with lung cancer, but can lead to cancer in other parts of the body.  Chronic obstructive pulmonary disease (COPD). This is a long-term lung condition that makes it hard to breathe. It also gets worse over time.  High blood pressure (hypertension), heart disease, stroke, or heart attack.  Lung infections, such as pneumonia.  Cataracts. This is when the lenses in the eyes become clouded.  Digestive problems. This may include peptic ulcers, heartburn, and gastroesophageal reflux disease (GERD).  Oral health problems, such as gum disease and tooth loss.  Loss of taste and smell.  Smoking can affect your appearance by causing:  Wrinkles.  Yellow or stained teeth, fingers, and fingernails.  Smoking tobacco can also affect your social life.  Many workplaces, [employer], hotels, and public places are tobacco-free. This means that you may experience challenges in finding places to smoke when away from home.  The cost of a smoking habit can be  expensive. Expenses for someone who smokes come in two ways: ? You spend money on a regular basis to buy tobacco. ? Your health care costs in the long-term are higher if you smoke.  Tobacco smoke can also affect the health of those around you. Children of smokers have greater chances of: ? Sudden infant death syndrome (SIDS). ? Ear infections. ? Lung infections.  What lifestyle changes can be made?  Do not start smoking. Quit if you already do.  To quit smoking: ? Make a plan to quit smoking and commit yourself to it. Look for programs to help you and ask your health care provider for recommendations and ideas. ? Talk with your health care provider about using nicotine replacement medicines to help you quit. Medicine replacement medicines include gum, lozenges, patches, sprays, or pills. ? Do not replace cigarette smoking with electronic cigarettes, which are commonly called e-cigarettes. The safety of e-cigarettes is not known, and some may contain harmful chemicals. ? Avoid places, people, or situations that tempt you to smoke. ? If you try to quit but return to smoking, don't give up hope. It  is very common for people to try a number of times before they fully succeed. When you feel ready again, give it another try.  Quitting smoking might affect the way you eat as well as your weight. Be prepared to monitor your eating habits. Get support in planning and following a healthy diet.  Ask your health care provider about having regular tests (screenings) to check for cancer. This may include blood tests, imaging tests, and other tests.  Exercise regularly. Consider taking walks, joining a gym, or doing yoga or exercise classes.  Develop skills to manage your stress. These skills include meditation. What are the benefits of quitting smoking? By quitting smoking, you may:  Lower your risk of getting cancer and other diseases caused by smoking.  Live longer.  Breathe better.  Lower  your blood pressure and heart rate.  Stop your addiction to tobacco.  Stop creating secondhand smoke that hurts other people.  Improve your sense of taste and smell.  Look better over time, due to having fewer wrinkles and less staining.  What can happen if changes are not made? If you do not stop smoking, you may:  Get cancer and other diseases.  Develop COPD or other long-term (chronic) lung conditions.  Develop serious problems with your heart and blood vessels (cardiovascular system).  Need more tests to screen for problems caused by smoking.  Have higher, long-term healthcare costs from medicines or treatments related to smoking.  Continue to have worsening changes in your lungs, mouth, and nose.  Where to find support: To get support to quit smoking, consider:  Asking your health care provider for more information and resources.  Taking classes to learn more about quitting smoking.  Looking for local organizations that offer resources about quitting smoking.  Joining a support group for people who want to quit smoking in your local community.  Where to find more information: You may find more information about quitting smoking from:  HelpGuide.org: www.helpguide.org/articles/addictions/how-to-quit-smoking.htm  https://hall.com/: smokefree.gov  American Lung Association: www.lung.org  Contact a health care provider if:  You have problems breathing.  Your lips, nose, or fingers turn blue.  You have chest pain.  You are coughing up blood.  You feel faint or you pass out.  You have other noticeable changes that cause you to worry. Summary  Smoking tobacco can negatively affect your health, the health of those around you, your finances, and your social life.  Do not start smoking. Quit if you already do. If you need help quitting, ask your health care provider.  Think about joining a support group for people who want to quit smoking in your local  community. There are many effective programs that will help you to quit this behavior. This information is not intended to replace advice given to you by your health care provider. Make sure you discuss any questions you have with your health care provider. Document Released: 05/16/2016 Document Revised: 05/16/2016 Document Reviewed: 05/16/2016 Elsevier Interactive Patient Education  [employer].

## 2018-04-17 ENCOUNTER — Encounter: Payer: Self-pay | Admitting: Podiatry

## 2018-04-17 ENCOUNTER — Ambulatory Visit (INDEPENDENT_AMBULATORY_CARE_PROVIDER_SITE_OTHER): Payer: Self-pay | Admitting: Orthotics

## 2018-04-17 ENCOUNTER — Ambulatory Visit (INDEPENDENT_AMBULATORY_CARE_PROVIDER_SITE_OTHER): Payer: 59

## 2018-04-17 ENCOUNTER — Ambulatory Visit (INDEPENDENT_AMBULATORY_CARE_PROVIDER_SITE_OTHER): Payer: 59 | Admitting: Podiatry

## 2018-04-17 DIAGNOSIS — B351 Tinea unguium: Secondary | ICD-10-CM

## 2018-04-17 DIAGNOSIS — L84 Corns and callosities: Secondary | ICD-10-CM

## 2018-04-17 DIAGNOSIS — M205X1 Other deformities of toe(s) (acquired), right foot: Secondary | ICD-10-CM

## 2018-04-17 DIAGNOSIS — E1142 Type 2 diabetes mellitus with diabetic polyneuropathy: Secondary | ICD-10-CM | POA: Diagnosis not present

## 2018-04-17 DIAGNOSIS — Q828 Other specified congenital malformations of skin: Secondary | ICD-10-CM | POA: Diagnosis not present

## 2018-04-17 DIAGNOSIS — M79676 Pain in unspecified toe(s): Secondary | ICD-10-CM

## 2018-04-17 MED ORDER — TRAMADOL HCL 50 MG PO TABS: 50.0000 mg | ORAL_TABLET | Freq: Three times a day (TID) | ORAL | 0 refills | Status: DC | PRN

## 2018-04-17 NOTE — Progress Notes (Signed)
He presents today chief complaint of pain to the first metatarsophalangeal joint of the right foot he is been seen by Dr. Sharyon Cable for routine nail debridement and calluses.  At this point he needs to have his calluses trimmed once again.  He is getting a hernia repair next month he is also having his teeth extracted next month.  Objective: Vital signs stable he is alert oriented x3 pulses are palpable.  Pulses are palpable.  Skin is dry large reactive hyperkeratotic lesions plantar aspect of the bilateral foot.  Toenails are long thick yellow dystrophic with mycotic he has pain on attempted range of motion of the first metatarsal phalangeal joint.  Radiographs taken today demonstrate near complete bone-on-bone contact first metatarsophalangeal joint right greater than left.  Assessment: Pain in limb secondary to onychomycosis and reactive hyperkeratotic lesions.  Plan: Discussed the etiology pathology conservative surgical therapies discussed the need for surgical intervention on which she does not want to do.  I debrided his nails bilaterally.  Debrided reactive hyperkeratoses bilateral.  I did provide him with a tramadol prescription.  No more will be provided until surgery.

## 2018-04-23 ENCOUNTER — Telehealth: Payer: Self-pay | Admitting: *Deleted

## 2018-04-23 NOTE — Telephone Encounter (Signed)
Received referral for initial lung cancer screening scan. Contacted patient and obtained smoking history,(.5ppd x 80yrs). Patient is aware that he does not meet eligibility for lung screening at this time.

## 2018-04-30 ENCOUNTER — Ambulatory Visit: Payer: 59 | Admitting: Surgery

## 2018-05-02 ENCOUNTER — Ambulatory Visit: Payer: 59 | Admitting: Surgery

## 2018-05-16 ENCOUNTER — Other Ambulatory Visit: Payer: Self-pay

## 2018-05-16 ENCOUNTER — Encounter: Payer: Self-pay | Admitting: Surgery

## 2018-05-16 ENCOUNTER — Ambulatory Visit (INDEPENDENT_AMBULATORY_CARE_PROVIDER_SITE_OTHER): Payer: 59 | Admitting: Surgery

## 2018-05-16 ENCOUNTER — Telehealth: Payer: Self-pay | Admitting: *Deleted

## 2018-05-16 ENCOUNTER — Encounter: Payer: Self-pay | Admitting: *Deleted

## 2018-05-16 VITALS — BP 139/94 | HR 80 | Temp 97.9°F | Resp 13 | Ht 68.0 in | Wt 149.0 lb

## 2018-05-16 DIAGNOSIS — K4020 Bilateral inguinal hernia, without obstruction or gangrene, not specified as recurrent: Secondary | ICD-10-CM

## 2018-05-16 DIAGNOSIS — K4090 Unilateral inguinal hernia, without obstruction or gangrene, not specified as recurrent: Secondary | ICD-10-CM | POA: Insufficient documentation

## 2018-05-16 NOTE — Progress Notes (Signed)
Surgical Clinic Progress/Follow-up Note   HPI:  58 y.o. Male presents to clinic for follow-up evaluation of his Left inguinal hernia. When patient was evaluated 6 weeks ago, he reported he was scheduled to have teeth extracted for pain with denture placement and unspecified foot surgery that review of his record indicates is for arthritis (though patient says it is for arthritis and gout). He is still awaiting Medicaid approval for his reportedly pending dental surgery, and he expresses frustration that none of his 3 complaints have been addressed yet. However, he is documented by podiatry as having refused his podiatric surgery in favor of repairing his hernia first. When discussed, he says he can avoid putting pressure on his foot, while his Left groin continues to cause him pain with even limited activities. He otherwise denies any further constipation or blood per rectum with +flatus and +BM's WNL and denies fever/chills, N/V, CP, SOB, or any indications of foot or mouth infections. He acknowledges that he continues to smoke cigarettes, acknowledges associated general and operative risks.  Review of Systems:  Constitutional: denies any other weight loss, fever, chills, or sweats  Eyes: denies any other vision changes, history of eye injury  ENT: denies sore throat, hearing problems  Respiratory: denies shortness of breath, wheezing  Cardiovascular: denies chest pain, palpitations  Gastrointestinal: abdominal pain, N/V, and bowel function as per HPI Musculoskeletal: denies any other joint pains or cramps  Skin: Denies any other rashes or skin discolorations  Neurological: denies any other headache, dizziness, weakness  Psychiatric: denies any other depression, anxiety  All other review of systems: otherwise negative   Vital Signs:  BP (!) 139/94   Pulse 80   Temp 97.9 F (36.6 C) (Skin)   Resp 13   Ht 5\' 8"  (1.727 m)   Wt 149 lb (67.6 kg)   SpO2 98%   BMI 22.66 kg/m    Physical  Exam:  Constitutional:  -- Normal body habitus  -- Awake, alert, and oriented x3  Eyes:  -- Pupils equally round and reactive to light  -- No scleral icterus  Ear, nose, throat:  -- No jugular venous distension  -- No nasal drainage, bleeding Pulmonary:  -- No crackles -- Equal breath sounds bilaterally -- Breathing non-labored at rest Cardiovascular:  -- S1, S2 present  -- No pericardial rubs  Gastrointestinal:  -- Soft, nontender, non-distended, no guarding/rebound -- Mildly tender to palpation easily reducible Left inguinal hernia -- Non-tender to palpation easily reducible Right inguinal hernia -- No other abdominal masses appreciated, pulsatile or otherwise  Musculoskeletal / Integumentary:  -- Wounds or skin discoloration: None appreciated  -- Extremities: B/L UE and LE FROM, hands and feet warm, no edema  Neurologic:  -- Motor function: intact and symmetric  -- Sensation: intact and symmetric   Imaging: No new pertinent imaging studies available for review at this time   Assessment/Plan:  58 y.o. male with Left symptomatic (painful) and Rightt asymptomatic easily reducible B/L inguinal hernias, complicated by co-morbidities including DM (unclear how well controlled), HTN, HLD, chronic ongoing tobacco abuse (smoking), poor dentition, medical noncompliance, and former cocaine abuse.  - again discussed with patient signs and symptoms of hernia incarceration and obstruction - strategies for manual self-reduction of patient's B/L inguinal hernias also again reviewed and discussed             - maintain hydration with high fiber heart healthy diet to reduce/minimize constipation +/- daily stool softener as needed - all risks, benefits, and alternatives  tolaparoscopic repair of B/L inguinal hernias with meshwere again discussed with the patient, all of his questions were answered to patient's expressed satisfaction, patient expresses he  wishes to proceed, and informed consent was obtained. - will plan for laparoscopic repair of B/L inguinal hernias with mesh pending anesthesia, OR availability, follow-up HbA1C, and toxicology screen considering history of cocaine abuse             - consistent blood sugar checks and control and smoking cessation discussed and strongly encouraged             - anticipate return to clinic 2 weeks following above planned procedure - instructed to call if any questions or concerns  All of the above recommendations were discussed with the patient, and all of patient's questions were answered to his expressed satisfaction.  Thank you for the opportunity to participate in this patient's care.  -- Marilynne Drivers Rosana Hoes, MD, [place]: [facility] - Partnering for exceptional care. Office: [phone]

## 2018-05-16 NOTE — Telephone Encounter (Signed)
Patient was contacted and surgery date for 05-22-18 was confirmed.   The patient was also notified of Pre-Admission appointment that is scheduled for 05-20-18 at 12:15 pm.  He verbalizes understanding.

## 2018-05-16 NOTE — Progress Notes (Signed)
Patient's surgery to be scheduled for 05-22-18 at [facility] with Dr. Rosana Hoes.  The patient is aware that he will need to  Pre-Admit prior to surgery. He will be contacted once this has been scheduled to notify him of date and time. Patient has requested that this be next Monday or Tuesday afternoon due to his work schedule and transportation issues. Patient will check in at the [facility], [address] (first floor).   The patient is aware to call the office should he have further questions.

## 2018-05-20 ENCOUNTER — Encounter
Admission: RE | Admit: 2018-05-20 | Discharge: 2018-05-20 | Disposition: A | Discharge status: Home / Self Care | Payer: 59 | Source: Ambulatory Visit | Attending: Surgery | Admitting: Surgery

## 2018-05-20 ENCOUNTER — Other Ambulatory Visit: Payer: Self-pay

## 2018-05-20 ENCOUNTER — Telehealth: Payer: Self-pay | Admitting: *Deleted

## 2018-05-20 DIAGNOSIS — E119 Type 2 diabetes mellitus without complications: Secondary | ICD-10-CM | POA: Insufficient documentation

## 2018-05-20 DIAGNOSIS — K4020 Bilateral inguinal hernia, without obstruction or gangrene, not specified as recurrent: Secondary | ICD-10-CM | POA: Insufficient documentation

## 2018-05-20 DIAGNOSIS — F1421 Cocaine dependence, in remission: Secondary | ICD-10-CM | POA: Insufficient documentation

## 2018-05-20 DIAGNOSIS — Z01818 Encounter for other preprocedural examination: Secondary | ICD-10-CM | POA: Diagnosis present

## 2018-05-20 HISTORY — DX: Unspecified osteoarthritis, unspecified site: M19.90

## 2018-05-20 HISTORY — DX: Gastro-esophageal reflux disease without esophagitis: K21.9

## 2018-05-20 HISTORY — DX: Polyneuropathy, unspecified: G62.9

## 2018-05-20 HISTORY — DX: Dyspnea, unspecified: R06.00

## 2018-05-20 HISTORY — DX: Gout, unspecified: M10.9

## 2018-05-20 HISTORY — DX: Other specified symptoms and signs involving the circulatory and respiratory systems: R09.89

## 2018-05-20 LAB — CBC WITH DIFFERENTIAL/PLATELET
Abs Immature Granulocytes: 0.02 10*3/uL (ref 0.00–0.07)
BASOS ABS: 0.1 10*3/uL (ref 0.0–0.1)
Basophils Relative: 1 %
Eosinophils Absolute: 0.1 10*3/uL (ref 0.0–0.5)
Eosinophils Relative: 1 %
HCT: 50.5 % (ref 39.0–52.0)
Hemoglobin: 17.3 g/dL — ABNORMAL HIGH (ref 13.0–17.0)
Immature Granulocytes: 0 %
Lymphocytes Relative: 43 %
Lymphs Abs: 2.8 10*3/uL (ref 0.7–4.0)
MCH: 33.1 pg (ref 26.0–34.0)
MCHC: 34.3 g/dL (ref 30.0–36.0)
MCV: 96.6 fL (ref 80.0–100.0)
Monocytes Absolute: 0.7 10*3/uL (ref 0.1–1.0)
Monocytes Relative: 11 %
Neutro Abs: 2.9 10*3/uL (ref 1.7–7.7)
Neutrophils Relative %: 44 %
PLATELETS: 228 10*3/uL (ref 150–400)
RBC: 5.23 MIL/uL (ref 4.22–5.81)
RDW: 12.5 % (ref 11.5–15.5)
WBC: 6.5 10*3/uL (ref 4.0–10.5)
nRBC: 0 % (ref 0.0–0.2)

## 2018-05-20 LAB — BASIC METABOLIC PANEL
Anion gap: 11 (ref 5–15)
BUN: 15 mg/dL (ref 6–20)
CALCIUM: 9 mg/dL (ref 8.9–10.3)
CHLORIDE: 106 mmol/L (ref 98–111)
CO2: 23 mmol/L (ref 22–32)
CREATININE: 0.91 mg/dL (ref 0.61–1.24)
GFR calc Af Amer: >60 mL/min (ref >60)
GFR calc non Af Amer: >60 mL/min (ref >60)
GLUCOSE: 100 mg/dL — AB (ref 70–99)
POTASSIUM: 3.8 mmol/L (ref 3.5–5.1)
Sodium: 140 mmol/L (ref 135–145)

## 2018-05-20 LAB — LIPID PANEL
CHOLESTEROL: 129 mg/dL (ref 0–200)
HDL: 55 mg/dL (ref >40)
LDL Cholesterol: 32 mg/dL (ref 0–99)
TRIGLYCERIDES: 209 mg/dL — AB (ref <150)
Total CHOL/HDL Ratio: 2.3 RATIO
VLDL: 42 mg/dL — ABNORMAL HIGH (ref 0–40)

## 2018-05-20 LAB — HEPATIC FUNCTION PANEL
ALK PHOS: 70 U/L (ref 38–126)
ALT: 25 U/L (ref 0–44)
AST: 18 U/L (ref 15–41)
Albumin: 4.3 g/dL (ref 3.5–5.0)
Total Bilirubin: 0.6 mg/dL (ref 0.3–1.2)
Total Protein: 7.6 g/dL (ref 6.5–8.1)

## 2018-05-20 LAB — URINE DRUG SCREEN, QUALITATIVE (ARMC ONLY)
Amphetamines, Ur Screen: NOT DETECTED
Barbiturates, Ur Screen: NOT DETECTED
Benzodiazepine, Ur Scrn: NOT DETECTED
Cannabinoid 50 Ng, Ur ~~LOC~~: NOT DETECTED
Cocaine Metabolite,Ur ~~LOC~~: POSITIVE — AB
MDMA (Ecstasy)Ur Screen: NOT DETECTED
Methadone Scn, Ur: NOT DETECTED
Opiate, Ur Screen: NOT DETECTED
Phencyclidine (PCP) Ur S: NOT DETECTED
Tricyclic, Ur Screen: NOT DETECTED

## 2018-05-20 NOTE — Pre-Procedure Instructions (Addendum)
SPOKE WITH SANDT IN LAB. LIPID INCORRECTLY ORDERED INSTEAD OF HEPATIC FUNCTION PANEL . THEY WILL RUN CORRECT TEST AND DELETE CHARGE FOR LIPID PANEL. UDS FAXED TO DR DAVIS.

## 2018-05-20 NOTE — Telephone Encounter (Signed)
-----   Message from Vickie Epley, MD sent at 05/16/2018  9:48 PM EST ----- Regarding: Laparoscopic B/L inguinal hernia repair with Pabon [facility],  While writing my note for today's encounter, I noticed in my prior note from early November that I was planning for laparoscopic repair of B/L inguinal hernias, whereas he only complained today of his Left inguinal hernia. He should be scheduled for laparoscopic repair of his B/L inguinal hernias with Pabon to assist (any Friday). My orders and consent will reflect this as the planned surgery.  Please let me know if any questions. Thank you.          Corene Cornea

## 2018-05-20 NOTE — Telephone Encounter (Signed)
Patient aware that Dr. Rosana Hoes will be repairing the bilateral inguinal hernias not just the left inguinal hernia.  He verbalizes understanding.

## 2018-05-20 NOTE — Patient Instructions (Signed)
Your procedure is scheduled on: 05/22/18 Report to Day Surgery. [facility]OND FLOOR To find out your arrival time please call [phone] between 1PM - 3PM on 05/21/18  Remember: Instructions that are not followed completely may result in serious medical risk,  up to and including death, or upon the discretion of your surgeon and anesthesiologist your  surgery may need to be rescheduled.     _X__ 1. Do not eat food after midnight the night before your procedure.                 No gum chewing or hard candies. You may drink clear liquids up to 2 hours                 before you are scheduled to arrive for your surgery- DO not drink clear                 liquids within 2 hours of the start of your surgery.                 Clear Liquids include:  water, apple juice without pulp, clear carbohydrate                 drink such as Clearfast of Gatorade, Black Coffee or Tea (Do not add                 anything to coffee or tea).  __X__2.  On the morning of surgery brush your teeth with toothpaste and water, you                may rinse your mouth with mouthwash if you wish.  Do not swallow any toothpaste of mouthwash.     _X__ 3.  No Alcohol for 24 hours before or after surgery.   _X__ 4.  Do Not Smoke or use e-cigarettes For 24 Hours Prior to Your Surgery.                 Do not use any chewable tobacco products for at least 6 hours prior to                 surgery.  ____  5.  Bring all medications with you on the day of surgery if instructed.   __X__  6.  Notify your doctor if there is any change in your medical condition      (cold, fever, infections).     Do not wear jewelry, make-up, hairpins, clips or nail polish. Do not wear lotions, powders, or perfumes. You may wear deodorant. Do not shave 48 hours prior to surgery. Men may shave face and neck. Do not bring valuables to the hospital.    [facility] is not responsible for any belongings or  valuables.  Contacts, dentures or bridgework may not be worn into surgery. Leave your suitcase in the car. After surgery it may be brought to your room. For patients admitted to the hospital, discharge time is determined by your treatment team.   Patients discharged the day of surgery will not be allowed to drive home.   Please read over the following fact sheets that you were given:   Surgical Site Infection Prevention    __X__ Take these medicines the morning of surgery with A SIP OF WATER:    1. SERTRALINE  2. SIMVASTATIN  3. COLCHICINE  4. GABAPENTIN  5.INDOMETHIACIN  6.PANTOPRAZOLE AT BEDTIME 05/21/18 AND MORNING OF SURGERY  ____ Fleet Enema (as directed)  __X__ Use CHG Soap as directed  ____ Use inhalers on the day of surgery  _X___ Stop metformin 2 days prior to surgery    _X___ Take 1/2 of usual insulin dose the night before surgery. No insulin the morning          of surgery.   ____ Stop Coumadin/Plavix/aspirin on   ____ Stop Anti-inflammatories on    ____ Stop supplements until after surgery.    ____ Bring C-Pap to the hospital.

## 2018-05-20 NOTE — Addendum Note (Signed)
Addended by: Caroleen Hamman F on: 05/20/2018 12:35 PM   Modules accepted: Orders

## 2018-05-20 NOTE — Pre-Procedure Instructions (Signed)
EKG COMPARED WITH 2014

## 2018-05-21 ENCOUNTER — Telehealth: Payer: Self-pay

## 2018-05-21 LAB — HEMOGLOBIN A1C
HEMOGLOBIN A1C: 6.5 % — AB (ref 4.8–5.6)
Mean Plasma Glucose: 139.85 mg/dL

## 2018-05-21 NOTE — Telephone Encounter (Signed)
Received a message from pre admit testing that the patient tested positive for Cocaine. Will have to reschedule his surgery to another date to allow the drug to clear his system. Patient informed and will have surgery rescheduled to 05/31/18 at [facility] with Dr Rosana Hoes. He is aware to refrain from drug use and to call the day before to get his arrival time.

## 2018-05-29 ENCOUNTER — Other Ambulatory Visit: Payer: Self-pay

## 2018-05-29 ENCOUNTER — Telehealth: Payer: Self-pay

## 2018-05-29 DIAGNOSIS — R825 Elevated urine levels of drugs, medicaments and biological substances: Secondary | ICD-10-CM

## 2018-05-29 NOTE — Telephone Encounter (Signed)
Patient notified he will need to have a drug test completed at [facility] by tomorrow. He states that he will get a ride tomorrow morning to get this done.

## 2018-05-30 ENCOUNTER — Other Ambulatory Visit
Admission: RE | Admit: 2018-05-30 | Discharge: 2018-05-30 | Disposition: A | Discharge status: Home / Self Care | Payer: 59 | Source: Ambulatory Visit | Attending: Surgery | Admitting: Surgery

## 2018-05-30 ENCOUNTER — Telehealth: Payer: Self-pay | Admitting: *Deleted

## 2018-05-30 DIAGNOSIS — R825 Elevated urine levels of drugs, medicaments and biological substances: Secondary | ICD-10-CM | POA: Insufficient documentation

## 2018-05-30 LAB — URINE DRUG SCREEN, QUALITATIVE (ARMC ONLY)
Amphetamines, Ur Screen: NOT DETECTED
BENZODIAZEPINE, UR SCRN: NOT DETECTED
Barbiturates, Ur Screen: NOT DETECTED
Cannabinoid 50 Ng, Ur ~~LOC~~: NOT DETECTED
Cocaine Metabolite,Ur ~~LOC~~: POSITIVE — AB
MDMA (Ecstasy)Ur Screen: NOT DETECTED
Methadone Scn, Ur: NOT DETECTED
Opiate, Ur Screen: NOT DETECTED
Phencyclidine (PCP) Ur S: NOT DETECTED
Tricyclic, Ur Screen: NOT DETECTED

## 2018-05-30 NOTE — Telephone Encounter (Signed)
Brandy in [facility] aware of cancellation.   She will try and put surgery in the depot-she is unsure of how to do this at the moment.

## 2018-05-30 NOTE — Telephone Encounter (Signed)
Patient called the office back and states he forgot he was supposed to have the urine drug screen done today.   The patient will have done this afternoon at [facility].   He is aware if this is not done, surgery will be rescheduled per Dr. Rosana Hoes.

## 2018-05-30 NOTE — Telephone Encounter (Signed)
Patient did not answer on cell number.   The patient's daughter, Lonzo Candy, was contacted and aware that patient needs to call us as soon as possible.   Patient needs to have urine drug screen today; otherwise, surgery with Dr. Rosana Hoes will need to be cancelled.

## 2018-05-30 NOTE — Telephone Encounter (Signed)
Patient contacted today and notified of urine drug screen results-positive for cocaine.   The patient is aware that he needs to remain free of drugs if he wants to have surgery completed.  Patient wishes to be rescheduled for surgery when Dr. Rosana Hoes and Dr. Dahlia Byes are available.   Will have Caryl-Lyn contact patient tomorrow in regards to rescheduling.

## 2018-05-31 ENCOUNTER — Ambulatory Visit: Admission: RE | Admit: 2018-05-31 | Payer: 59 | Source: Home / Self Care | Admitting: Surgery

## 2018-05-31 ENCOUNTER — Encounter: Admission: RE | Payer: Self-pay | Source: Home / Self Care

## 2018-05-31 SURGERY — REPAIR, HERNIA, INGUINAL, ADULT
Anesthesia: General | Laterality: Bilateral

## 2018-06-04 ENCOUNTER — Telehealth: Payer: Self-pay

## 2018-06-04 DIAGNOSIS — R825 Elevated urine levels of drugs, medicaments and biological substances: Secondary | ICD-10-CM

## 2018-06-04 NOTE — Addendum Note (Signed)
Addended by: Lesly Rubenstein on: 06/04/2018 12:00 PM   Modules accepted: Orders

## 2018-06-04 NOTE — Telephone Encounter (Signed)
Call to the patient to see about rescheduling his surgery. I did inform the patient that per Dr Rosana Hoes he will need to have a drug screen done 5 days prior to surgery and also have one done the day of surgery. I informed him that if he tested positive with either of these tests that we would cancel his surgery and we would not be rescheduling the surgery. He did express his understanding and wishes to proceed with surgery.

## 2018-06-04 NOTE — Telephone Encounter (Signed)
The patient is rescheduled for surgery at [facility] with Dr Rosana Hoes on 06/28/18. He is aware to call the day prior to get his arrival time. He is aware that he will have a urine drug screen 5 days prior and also the morning of surgery. We will call him to remind of his drug screen due on 06/23/18. Dr Caroleen Hamman will be assisting with this case.

## 2018-06-21 ENCOUNTER — Telehealth: Payer: Self-pay

## 2018-06-21 NOTE — Telephone Encounter (Signed)
Called and spoke with the patient and let him know that he will need to complete his urine drug screen at [facility] either this Saturday or Sunday, 06/22/18 or 06/23/18. I made him aware of he failed to complete this or if the drug screen was positive that we would cancel his surgery scheduled for 06/28/18 and would not be rescheduling again. Patient is aware.

## 2018-06-24 ENCOUNTER — Telehealth: Payer: Self-pay

## 2018-06-24 ENCOUNTER — Ambulatory Visit: Payer: 59 | Admitting: Podiatry

## 2018-06-24 NOTE — Telephone Encounter (Signed)
I spoke with the patient this morning and he did not get his urine drug screen completed this weekend. He says that he has some teeth pulled last week and did not give a reason as to why he did not get the testing done. I informed that patient that if he did not get it done today that his surgery would be canceled and not be rescheduled. He expressed understanding and would get this completed today.

## 2018-06-25 ENCOUNTER — Telehealth: Payer: Self-pay | Admitting: Surgery

## 2018-06-25 NOTE — Telephone Encounter (Signed)
I spoke with patient today about his failure to comply with mandatory urine drug screen. Clinical staff have asked multiple times to complete his urine drug screen and he has not done it. Therefore, I told him that he will no longer be a candidate for surgery with Dr. Rosana Hoes. The patient said that he understood.

## 2018-06-28 ENCOUNTER — Ambulatory Visit: Admit: 2018-06-28 | Payer: 59 | Admitting: Surgery

## 2018-06-28 SURGERY — REPAIR, HERNIA, INGUINAL, BILATERAL, LAPAROSCOPIC
Anesthesia: General | Laterality: Bilateral

## 2018-07-01 ENCOUNTER — Encounter: Payer: Self-pay | Admitting: Podiatry

## 2018-07-01 ENCOUNTER — Ambulatory Visit (INDEPENDENT_AMBULATORY_CARE_PROVIDER_SITE_OTHER): Payer: 59 | Admitting: Podiatry

## 2018-07-01 DIAGNOSIS — E1142 Type 2 diabetes mellitus with diabetic polyneuropathy: Secondary | ICD-10-CM | POA: Diagnosis not present

## 2018-07-01 DIAGNOSIS — B351 Tinea unguium: Secondary | ICD-10-CM

## 2018-07-01 DIAGNOSIS — M205X1 Other deformities of toe(s) (acquired), right foot: Secondary | ICD-10-CM | POA: Diagnosis not present

## 2018-07-01 DIAGNOSIS — M79676 Pain in unspecified toe(s): Secondary | ICD-10-CM

## 2018-07-01 DIAGNOSIS — L84 Corns and callosities: Secondary | ICD-10-CM | POA: Diagnosis not present

## 2018-07-01 NOTE — Progress Notes (Signed)
Complaint:  Visit Type: Patient returns to my office for continued preventative foot care services. Complaint: Patient states" my nails have grown long and thick and become painful to walk and wear shoes" Patient has been diagnosed with DM with no foot complications. The patient presents for preventative foot care services. No changes to ROS  Podiatric Exam: Vascular: dorsalis pedis and posterior tibial pulses are palpable bilateral. Capillary return is immediate. Temperature gradient is WNL. Skin turgor WNL  Sensorium: Normal Semmes Weinstein monofilament test. Normal tactile sensation bilaterally. Nail Exam: Pt has thick disfigured discolored nails with subungual debris noted bilateral entire nail hallux through fifth toenails Ulcer Exam: There is no evidence of ulcer or pre-ulcerative changes or infection. Orthopedic Exam: Muscle tone and strength are WNL. No limitations in general ROM. No crepitus or effusions noted. Foot type and digits show no abnormalities. Bony prominences are unremarkable. Skin: No Porokeratosis. No infection or ulcers.  Pinch callus hallux  B/L.  Heel callus  B/l.  Diagnosis:  Onychomycosis, , Pain in right toe, pain in left toes Heel callus  B/L  Picnch callus  B/L  Treatment & Plan Procedures and Treatment: Consent by patient was obtained for treatment procedures.   Debridement of mycotic and hypertrophic toenails, 1 through 5 bilateral and clearing of subungual debris. No ulceration, no infection noted. Debride callus  B/L. Return Visit-Office Procedure: Patient instructed to return to the office for a follow up visit 10 weeks  for continued evaluation and treatment. Previously discussed surgery with Dr.  Milinda Pointer.    Gardiner Barefoot DPM

## 2018-09-27 ENCOUNTER — Encounter: Payer: Self-pay | Admitting: Emergency Medicine

## 2018-09-27 ENCOUNTER — Emergency Department: Payer: 59

## 2018-09-27 ENCOUNTER — Emergency Department
Admission: EM | Admit: 2018-09-27 | Discharge: 2018-09-27 | Disposition: A | Discharge status: Home / Self Care | Payer: 59 | Attending: Emergency Medicine | Admitting: Emergency Medicine

## 2018-09-27 ENCOUNTER — Other Ambulatory Visit: Payer: Self-pay

## 2018-09-27 DIAGNOSIS — Z7982 Long term (current) use of aspirin: Secondary | ICD-10-CM | POA: Insufficient documentation

## 2018-09-27 DIAGNOSIS — L739 Follicular disorder, unspecified: Secondary | ICD-10-CM | POA: Diagnosis not present

## 2018-09-27 DIAGNOSIS — R0789 Other chest pain: Secondary | ICD-10-CM | POA: Diagnosis present

## 2018-09-27 DIAGNOSIS — Z79899 Other long term (current) drug therapy: Secondary | ICD-10-CM | POA: Insufficient documentation

## 2018-09-27 DIAGNOSIS — I10 Essential (primary) hypertension: Secondary | ICD-10-CM | POA: Diagnosis not present

## 2018-09-27 DIAGNOSIS — Z794 Long term (current) use of insulin: Secondary | ICD-10-CM | POA: Insufficient documentation

## 2018-09-27 DIAGNOSIS — E119 Type 2 diabetes mellitus without complications: Secondary | ICD-10-CM | POA: Diagnosis not present

## 2018-09-27 DIAGNOSIS — F17200 Nicotine dependence, unspecified, uncomplicated: Secondary | ICD-10-CM | POA: Diagnosis not present

## 2018-09-27 LAB — CBC
HCT: 49.4 % (ref 39.0–52.0)
Hemoglobin: 16.9 g/dL (ref 13.0–17.0)
MCH: 32.4 pg (ref 26.0–34.0)
MCHC: 34.2 g/dL (ref 30.0–36.0)
MCV: 94.6 fL (ref 80.0–100.0)
Platelets: 175 10*3/uL (ref 150–400)
RBC: 5.22 MIL/uL (ref 4.22–5.81)
RDW: 12.1 % (ref 11.5–15.5)
WBC: 4.1 10*3/uL (ref 4.0–10.5)
nRBC: 0 % (ref 0.0–0.2)

## 2018-09-27 LAB — BASIC METABOLIC PANEL
Anion gap: 10 (ref 5–15)
BUN: 9 mg/dL (ref 6–20)
CO2: 23 mmol/L (ref 22–32)
Calcium: 8.8 mg/dL — ABNORMAL LOW (ref 8.9–10.3)
Chloride: 103 mmol/L (ref 98–111)
Creatinine, Ser: 0.8 mg/dL (ref 0.61–1.24)
GFR calc Af Amer: >60 mL/min (ref >60)
GFR calc non Af Amer: >60 mL/min (ref >60)
Glucose, Bld: 136 mg/dL — ABNORMAL HIGH (ref 70–99)
Potassium: 4.1 mmol/L (ref 3.5–5.1)
Sodium: 136 mmol/L (ref 135–145)

## 2018-09-27 LAB — FIBRIN DERIVATIVES D-DIMER (ARMC ONLY): Fibrin derivatives D-dimer (ARMC): 484.3 ng/mL (FEU) (ref 0.00–499.00)

## 2018-09-27 LAB — TROPONIN I
Troponin I: <0.03 ng/mL (ref <0.03)
Troponin I: <0.03 ng/mL (ref <0.03)

## 2018-09-27 MED ORDER — MUPIROCIN CALCIUM 2 % EX CREA: TOPICAL_CREAM | CUTANEOUS | 0 refills | Status: AC

## 2018-09-27 MED ORDER — TRAMADOL HCL 50 MG PO TABS
50.0000 mg | ORAL_TABLET | Freq: Once | ORAL | Status: AC
  Administered 2018-09-27: 50 mg via ORAL
  Filled 2018-09-27: qty 1

## 2018-09-27 MED ORDER — ASPIRIN 81 MG PO CHEW
162.0000 mg | CHEWABLE_TABLET | Freq: Once | ORAL | Status: AC
  Administered 2018-09-27: 162 mg via ORAL
  Filled 2018-09-27: qty 2

## 2018-09-27 MED ORDER — SODIUM CHLORIDE 0.9% FLUSH: 3.0000 mL | Freq: Once | INTRAVENOUS | Status: DC

## 2018-09-27 MED ORDER — TRAMADOL HCL 50 MG PO TABS: 50.0000 mg | ORAL_TABLET | Freq: Four times a day (QID) | ORAL | 0 refills | Status: AC | PRN

## 2018-09-27 NOTE — ED Triage Notes (Signed)
Pt to ER viaPOV from [facility] with c/o chest pain that started yesterday.  States pain is mid sternal and radiates to center of back.  Pt denies n/v, diaphoresis, or other radiation.  Pt states pain increased with deep inspiration, especially to his back.

## 2018-09-27 NOTE — ED Notes (Signed)
Pt transported to xray by x ray tech

## 2018-09-27 NOTE — ED Provider Notes (Signed)
[facility] Emergency Department Provider Note ____________________________________________   First MD Initiated Contact with Patient 09/27/18 1010     (approximate)  I have reviewed the triage vital signs and the nursing notes.   HISTORY  Chief Complaint Chest Pain    HPI Dennis Rowland. is a 58 y.o. male with PMH as noted below but no prior cardiac history who presents with chest pain, gradual onset yesterday evening, and starting when the patient was doing some weed whacking.  It has persisted since then and is nonexertional.  It is substernal in location and radiating to the back.  He describes it as squeezing.  It is worse with deep inspiration.  He denies associated shortness of breath, nausea or vomiting, diaphoresis, or lightheadedness.  No prior history of this pain.  No leg pain or swelling.   Past Medical History:  Diagnosis Date  . Arthritis   . Chronic sniffling   . Diabetes ([place])   . Dyspnea    some doe  . GERD (gastroesophageal reflux disease)   . Gout   . Hyperlipidemia   . Hypertension   . Neuropathy     Patient Active Problem List   Diagnosis Date Noted  . Unilateral inguinal hernia without obstruction or gangrene 05/16/2018    Past Surgical History:  Procedure Laterality Date  . NO PAST SURGERIES      Prior to Admission medications   Medication Sig Start Date End Date Taking? Authorizing Provider  aspirin EC 81 MG tablet Take 81 mg by mouth daily.     [provider]  cetirizine (ZYRTEC) 10 MG tablet Take 10 mg by mouth daily.     [provider]  chlorhexidine (PERIDEX) 0.12 % solution Use as directed 15 mLs in the mouth or throat daily.  03/13/18   [provider]  colchicine 0.6 MG tablet Take 0.6 mg by mouth daily.     [provider]  gabapentin (NEURONTIN) 300 MG capsule Take 300 mg by mouth every 4 (four) hours as needed (for pain).     [provider]   HYDROcodone-acetaminophen (NORCO/VICODIN) 5-325 MG tablet TAKE 1 TABLET BY MOUTH EVERY 4 TO 6 HOURS AS NEEDED FOR PAIN 06/12/18   [provider]  indomethacin (INDOCIN) 50 MG capsule Take 50 mg by mouth daily.     [provider]  Insulin Detemir (LEVEMIR FLEXTOUCH) 100 UNIT/ML Pen Inject 40 Units into the skin at bedtime.     [provider]  lisinopril (PRINIVIL,ZESTRIL) 5 MG tablet Take 5 mg by mouth daily.  03/02/15   [provider]  meloxicam (MOBIC) 15 MG tablet Take 1 tablet (15 mg total) by mouth daily. 03/25/18   Gardiner Barefoot, DPM  metFORMIN (GLUCOPHAGE) 500 MG tablet Take 500 mg by mouth 2 (two) times daily with a meal.     [provider]  mupirocin cream (BACTROBAN) 2 % Apply to affected area 3 times daily 09/27/18 09/27/19  Arta Silence, MD  pantoprazole (PROTONIX) 40 MG tablet Take 40 mg by mouth daily.     [provider]  sertraline (ZOLOFT) 100 MG tablet Take 100 mg by mouth daily.    [provider]  simvastatin (ZOCOR) 20 MG tablet Take 20 mg by mouth daily.     [provider]  traMADol (ULTRAM) 50 MG tablet Take 1 tablet (50 mg total) by mouth every 6 (six) hours as needed for up to 5 days. 09/27/18 10/02/18  Capri Veals,  Felix Ahmadi, MD    Allergies Tape; Dye fdc red [red dye]; and Hydrochlorothiazide w-triamterene  History reviewed. No pertinent family history.  Social History Social History   Tobacco Use  . Smoking status: Current Every Day Smoker  . Smokeless tobacco: Never Used  Substance Use Topics  . Alcohol use: Yes    Alcohol/week: 6.0 standard drinks    Types: 6 Cans of beer per week    Comment: DAILY  . Drug use: Yes    Types: Cocaine, Marijuana    Review of Systems  Constitutional: No fever. Eyes: No redness. ENT: No sore throat. Cardiovascular: Positive for chest pain. Respiratory: Denies shortness of breath. Gastrointestinal: No nausea, vomiting or diarrhea.   Genitourinary: Negative for flank pain.  Musculoskeletal: Negative for back pain. Skin: Negative for rash. Neurological: Negative for headache.   ____________________________________________   PHYSICAL EXAM:  VITAL SIGNS: ED Triage Vitals  Enc Vitals Group     BP 09/27/18 1000 (!) 138/106     Pulse Rate 09/27/18 1000 69     Resp 09/27/18 1000 18     Temp 09/27/18 1000 98.1 F (36.7 C)     Temp Source 09/27/18 1000 Oral     SpO2 09/27/18 1000 98 %     Weight 09/27/18 0958 150 lb (68 kg)     Height 09/27/18 0958 5\' 6"  (1.676 m)     Head Circumference --      Peak Flow --      Pain Score 09/27/18 0958 7     Pain Loc --      Pain Edu? --      Excl. in [place]? --     Constitutional: Alert and oriented.  Relatively well appearing and in no acute distress. Eyes: Conjunctivae are normal.  Head: Atraumatic. Nose: No congestion/rhinnorhea. Mouth/Throat: Mucous membranes are moist.   Neck: Normal range of motion.  Cardiovascular: Normal rate, regular rhythm. Good peripheral circulation. Respiratory: Normal respiratory effort.  No retractions.  Gastrointestinal: No distention.  Musculoskeletal: No lower extremity edema.  No calf or popliteal swelling or tenderness.  Extremities warm and well perfused.  Neurologic:  Normal speech and language. No gross focal neurologic deficits are appreciated.  Skin:  Skin is warm and dry. No rash noted. Psychiatric: Mood and affect are normal. Speech and behavior are normal.  ____________________________________________   LABS (all labs ordered are listed, but only abnormal results are displayed)  Labs Reviewed  BASIC METABOLIC PANEL - Abnormal; Notable for the following components:      Result Value   Glucose, Bld 136 (*)    Calcium 8.8 (*)    All other components within normal limits  CBC  TROPONIN I  TROPONIN I  FIBRIN DERIVATIVES D-DIMER ([facility] ONLY)   ____________________________________________  EKG  ED ECG REPORT I, Arta Silence, the attending physician, personally viewed and interpreted this ECG.  Date: 09/27/2018 EKG Time: 0959 Rate: 77 Rhythm: normal sinus rhythm QRS Axis: normal Intervals: normal ST/T Wave abnormalities: normal Narrative Interpretation: no evidence of acute ischemia  ____________________________________________  RADIOLOGY  CXR: No acute abnormality  ____________________________________________   PROCEDURES  Procedure(s) performed: No  Procedures  Critical Care performed: No ____________________________________________   INITIAL IMPRESSION / ASSESSMENT AND PLAN / ED COURSE  Pertinent labs & imaging results that were available during my care of the patient were reviewed by me and considered in my medical decision making (see chart for details).  58 year old male with PMH as noted above but no prior cardiac  history presents with chest pain since yesterday which started while he was doing some weed whacking but has been nonexertional.  It is worse with deep inspiration, but not associated with shortness of breath, nausea or vomiting, lightheadedness, or other acute symptoms.  He states he took 1 aspirin last night.  I reviewed the past medical records in [place].  The patient has no prior ED visits or admissions here and no documented cardiac history.  On exam, the patient is well-appearing.  He has mild hypertension but otherwise normal vital signs.  The remainder the exam is as described above.  EKG is nonischemic.  The patient does have some risk factors for ACS although no history of CAD.  The pain is somewhat atypical in nature.  Although I cannot rule him out by [facility], there is no clinical evidence to suggest DVT or PE.  The patient does report radiation of the pain to the back although given his well appearance, reassuring vital signs, and lack of risk factors, there is no evidence of aortic dissection or other vascular etiology.  We will obtain chest x-ray, basic labs,  cardiac enzymes, and reassess.  ----------------------------------------- 2:36 PM on 09/27/2018 -----------------------------------------  Chest x-ray and initial troponin were both unremarkable.  Repeat troponin after 3 hours was also negative.  I also added on a d-dimer which was negative. The patient is currently pain-free.  He feels comfortable going home.  Prior to discharge he showed me a faint rash on his chest which is consistent with folliculitis.  I will prescribe Bactroban.  Return precautions given, the patient expresses understanding. ____________________________________________   FINAL CLINICAL IMPRESSION(S) / ED DIAGNOSES  Final diagnoses:  Atypical chest pain  Folliculitis      NEW MEDICATIONS STARTED DURING THIS VISIT:  New Prescriptions   MUPIROCIN CREAM (BACTROBAN) 2 %    Apply to affected area 3 times daily   TRAMADOL (ULTRAM) 50 MG TABLET    Take 1 tablet (50 mg total) by mouth every 6 (six) hours as needed for up to 5 days.     Note:  This document was prepared using Dragon voice recognition software and may include unintentional dictation errors.    Arta Silence, MD 09/27/18 [phone]

## 2018-09-27 NOTE — ED Notes (Signed)
Pt given meal tray.

## 2018-09-27 NOTE — Discharge Instructions (Signed)
Use the cream as prescribed to the affected areas on your chest over the next several days.  Return to the ER for new, worsening, or persistent severe chest pain, difficulty breathing, weakness or lightheadedness, or any other new or worsening symptoms that concern you.

## 2018-09-30 ENCOUNTER — Ambulatory Visit: Payer: 59 | Admitting: Podiatry

## 2018-09-30 ENCOUNTER — Ambulatory Visit (INDEPENDENT_AMBULATORY_CARE_PROVIDER_SITE_OTHER): Payer: 59 | Admitting: Podiatry

## 2018-09-30 ENCOUNTER — Other Ambulatory Visit: Payer: Self-pay

## 2018-09-30 ENCOUNTER — Encounter: Payer: Self-pay | Admitting: Podiatry

## 2018-09-30 VITALS — Temp 98.7°F

## 2018-09-30 DIAGNOSIS — E1142 Type 2 diabetes mellitus with diabetic polyneuropathy: Secondary | ICD-10-CM | POA: Diagnosis not present

## 2018-09-30 DIAGNOSIS — B351 Tinea unguium: Secondary | ICD-10-CM | POA: Diagnosis not present

## 2018-09-30 DIAGNOSIS — M205X1 Other deformities of toe(s) (acquired), right foot: Secondary | ICD-10-CM

## 2018-09-30 DIAGNOSIS — M79676 Pain in unspecified toe(s): Secondary | ICD-10-CM | POA: Diagnosis not present

## 2018-09-30 DIAGNOSIS — Q828 Other specified congenital malformations of skin: Secondary | ICD-10-CM

## 2018-09-30 DIAGNOSIS — L84 Corns and callosities: Secondary | ICD-10-CM | POA: Diagnosis not present

## 2018-09-30 NOTE — Progress Notes (Signed)
Complaint:  Visit Type: Patient returns to my office for continued preventative foot care services. Complaint: Patient states" my nails have grown long and thick and become painful to walk and wear shoes" Patient has been diagnosed with DM with no foot complications. The patient presents for preventative foot care services. No changes to ROS  Podiatric Exam: Vascular: dorsalis pedis and posterior tibial pulses are palpable bilateral. Capillary return is immediate. Temperature gradient is WNL. Skin turgor WNL  Sensorium: Normal Semmes Weinstein monofilament test. Normal tactile sensation bilaterally. Nail Exam: Pt has thick disfigured discolored nails with subungual debris noted bilateral entire nail hallux through fifth toenails Ulcer Exam: There is no evidence of ulcer or pre-ulcerative changes or infection. Orthopedic Exam: Muscle tone and strength are WNL. No limitations in general ROM. No crepitus or effusions noted. Foot type and digits show no abnormalities. HAV  B/L. Skin:  Porokeratosis sub 5th right.. No infection or ulcers.  Pinch callus hallux  B/L.  Heel callus  B/l.  Diagnosis:  Onychomycosis, , Pain in right toe, pain in left toes Heel callus  B/L  [place]nch callus  B/L  Treatment & Plan Procedures and Treatment: Consent by patient was obtained for treatment procedures.   Debridement of mycotic and hypertrophic toenails, 1 through 5 bilateral and clearing of subungual debris. No ulceration, no infection noted. Debride callus  B/L. Return Visit-Office Procedure: Patient instructed to return to the office for a follow up visit 10 weeks  for continued evaluation and treatment.     Gardiner Barefoot DPM

## 2018-11-20 DIAGNOSIS — R202 Paresthesia of skin: Secondary | ICD-10-CM | POA: Insufficient documentation

## 2018-11-20 DIAGNOSIS — R200 Anesthesia of skin: Secondary | ICD-10-CM | POA: Insufficient documentation

## 2018-11-20 DIAGNOSIS — M542 Cervicalgia: Secondary | ICD-10-CM | POA: Insufficient documentation

## 2018-12-09 ENCOUNTER — Ambulatory Visit: Payer: 59 | Admitting: Podiatry

## 2018-12-12 ENCOUNTER — Ambulatory Visit (INDEPENDENT_AMBULATORY_CARE_PROVIDER_SITE_OTHER): Payer: 59 | Admitting: Podiatry

## 2018-12-12 ENCOUNTER — Encounter: Payer: Self-pay | Admitting: Podiatry

## 2018-12-12 ENCOUNTER — Other Ambulatory Visit: Payer: Self-pay

## 2018-12-12 VITALS — Temp 97.7°F

## 2018-12-12 DIAGNOSIS — M79676 Pain in unspecified toe(s): Secondary | ICD-10-CM

## 2018-12-12 DIAGNOSIS — L84 Corns and callosities: Secondary | ICD-10-CM | POA: Diagnosis not present

## 2018-12-12 DIAGNOSIS — B351 Tinea unguium: Secondary | ICD-10-CM | POA: Diagnosis not present

## 2018-12-12 DIAGNOSIS — E1142 Type 2 diabetes mellitus with diabetic polyneuropathy: Secondary | ICD-10-CM

## 2018-12-12 DIAGNOSIS — Q828 Other specified congenital malformations of skin: Secondary | ICD-10-CM | POA: Diagnosis not present

## 2018-12-12 NOTE — Progress Notes (Signed)
Complaint:  Visit Type: Patient returns to my office for continued preventative foot care services. Complaint: Patient states" my nails have grown long and thick and become painful to walk and wear shoes" Patient has been diagnosed with DM with no foot complications. The patient presents for preventative foot care services. No changes to ROS  Podiatric Exam: Vascular: dorsalis pedis and posterior tibial pulses are palpable bilateral. Capillary return is immediate. Temperature gradient is WNL. Skin turgor WNL  Sensorium: Normal Semmes Weinstein monofilament test. Normal tactile sensation bilaterally. Nail Exam: Pt has thick disfigured discolored nails with subungual debris noted bilateral entire nail hallux through fifth toenails Ulcer Exam: There is no evidence of ulcer or pre-ulcerative changes or infection. Orthopedic Exam: Muscle tone and strength are WNL. No limitations in general ROM. No crepitus or effusions noted. Foot type and digits show no abnormalities. HAV  B/L. Skin:  Porokeratosis sub 5th right.. No infection or ulcers.  Pinch callus hallux  B/L.  Heel callus  B/l.  Diagnosis:  Onychomycosis, , Pain in right toe, pain in left toes Heel callus  B/L  Pinch callus  B/L  Treatment & Plan Procedures and Treatment: Consent by patient was obtained for treatment procedures.   Debridement of mycotic and hypertrophic toenails, 1 through 5 bilateral and clearing of subungual debris. No ulceration, no infection noted. Debride callus  B/L. Return Visit-Office Procedure: Patient instructed to return to the office for a follow up visit 9 weeks  for continued evaluation and treatment.     Gardiner Barefoot DPM

## 2019-02-05 ENCOUNTER — Inpatient Hospital Stay
Admission: EM | Admit: 2019-02-05 | Discharge: 2019-02-07 | DRG: 441 | Disposition: A | Discharge status: Home / Self Care | Payer: 59 | Attending: Specialist | Admitting: Specialist

## 2019-02-05 ENCOUNTER — Encounter: Payer: Self-pay | Admitting: Emergency Medicine

## 2019-02-05 ENCOUNTER — Emergency Department: Payer: 59

## 2019-02-05 ENCOUNTER — Other Ambulatory Visit: Payer: Self-pay

## 2019-02-05 DIAGNOSIS — R739 Hyperglycemia, unspecified: Secondary | ICD-10-CM

## 2019-02-05 DIAGNOSIS — E785 Hyperlipidemia, unspecified: Secondary | ICD-10-CM | POA: Diagnosis present

## 2019-02-05 DIAGNOSIS — R945 Abnormal results of liver function studies: Secondary | ICD-10-CM | POA: Diagnosis not present

## 2019-02-05 DIAGNOSIS — F1020 Alcohol dependence, uncomplicated: Secondary | ICD-10-CM | POA: Diagnosis present

## 2019-02-05 DIAGNOSIS — E1140 Type 2 diabetes mellitus with diabetic neuropathy, unspecified: Secondary | ICD-10-CM | POA: Diagnosis present

## 2019-02-05 DIAGNOSIS — Z7982 Long term (current) use of aspirin: Secondary | ICD-10-CM

## 2019-02-05 DIAGNOSIS — D6959 Other secondary thrombocytopenia: Secondary | ICD-10-CM | POA: Diagnosis present

## 2019-02-05 DIAGNOSIS — M109 Gout, unspecified: Secondary | ICD-10-CM | POA: Diagnosis present

## 2019-02-05 DIAGNOSIS — Z794 Long term (current) use of insulin: Secondary | ICD-10-CM

## 2019-02-05 DIAGNOSIS — Z20828 Contact with and (suspected) exposure to other viral communicable diseases: Secondary | ICD-10-CM | POA: Diagnosis present

## 2019-02-05 DIAGNOSIS — K219 Gastro-esophageal reflux disease without esophagitis: Secondary | ICD-10-CM | POA: Diagnosis present

## 2019-02-05 DIAGNOSIS — R7989 Other specified abnormal findings of blood chemistry: Secondary | ICD-10-CM

## 2019-02-05 DIAGNOSIS — F1410 Cocaine abuse, uncomplicated: Secondary | ICD-10-CM | POA: Diagnosis present

## 2019-02-05 DIAGNOSIS — D689 Coagulation defect, unspecified: Secondary | ICD-10-CM | POA: Diagnosis present

## 2019-02-05 DIAGNOSIS — F17200 Nicotine dependence, unspecified, uncomplicated: Secondary | ICD-10-CM | POA: Diagnosis present

## 2019-02-05 DIAGNOSIS — E1165 Type 2 diabetes mellitus with hyperglycemia: Secondary | ICD-10-CM | POA: Diagnosis present

## 2019-02-05 DIAGNOSIS — Z791 Long term (current) use of non-steroidal anti-inflammatories (NSAID): Secondary | ICD-10-CM | POA: Diagnosis not present

## 2019-02-05 DIAGNOSIS — R188 Other ascites: Secondary | ICD-10-CM | POA: Diagnosis present

## 2019-02-05 DIAGNOSIS — I10 Essential (primary) hypertension: Secondary | ICD-10-CM | POA: Diagnosis present

## 2019-02-05 DIAGNOSIS — B179 Acute viral hepatitis, unspecified: Secondary | ICD-10-CM | POA: Diagnosis present

## 2019-02-05 DIAGNOSIS — R1011 Right upper quadrant pain: Secondary | ICD-10-CM | POA: Diagnosis not present

## 2019-02-05 DIAGNOSIS — F329 Major depressive disorder, single episode, unspecified: Secondary | ICD-10-CM | POA: Diagnosis present

## 2019-02-05 DIAGNOSIS — K7200 Acute and subacute hepatic failure without coma: Secondary | ICD-10-CM | POA: Diagnosis present

## 2019-02-05 DIAGNOSIS — Z8249 Family history of ischemic heart disease and other diseases of the circulatory system: Secondary | ICD-10-CM

## 2019-02-05 DIAGNOSIS — Z888 Allergy status to other drugs, medicaments and biological substances status: Secondary | ICD-10-CM | POA: Diagnosis not present

## 2019-02-05 DIAGNOSIS — E119 Type 2 diabetes mellitus without complications: Secondary | ICD-10-CM

## 2019-02-05 HISTORY — DX: Other specified abnormal findings of blood chemistry: R79.89

## 2019-02-05 LAB — URINALYSIS, COMPLETE (UACMP) WITH MICROSCOPIC
Bacteria, UA: NONE SEEN
Bilirubin Urine: NEGATIVE
Glucose, UA: >=500 mg/dL — AB
Hgb urine dipstick: NEGATIVE
Ketones, ur: NEGATIVE mg/dL
Leukocytes,Ua: NEGATIVE
Nitrite: NEGATIVE
Protein, ur: NEGATIVE mg/dL
Specific Gravity, Urine: 1.031 — ABNORMAL HIGH (ref 1.005–1.030)
pH: 6 (ref 5.0–8.0)

## 2019-02-05 LAB — COMPREHENSIVE METABOLIC PANEL
ALT: 3930 U/L — ABNORMAL HIGH (ref 0–44)
AST: 2910 U/L — ABNORMAL HIGH (ref 15–41)
Albumin: 3.4 g/dL — ABNORMAL LOW (ref 3.5–5.0)
Alkaline Phosphatase: 70 U/L (ref 38–126)
Anion gap: 11 (ref 5–15)
BUN: 8 mg/dL (ref 6–20)
CO2: 26 mmol/L (ref 22–32)
Calcium: 8.5 mg/dL — ABNORMAL LOW (ref 8.9–10.3)
Chloride: 95 mmol/L — ABNORMAL LOW (ref 98–111)
Creatinine, Ser: 0.82 mg/dL (ref 0.61–1.24)
GFR calc Af Amer: >60 mL/min (ref >60)
GFR calc non Af Amer: >60 mL/min (ref >60)
Glucose, Bld: 551 mg/dL (ref 70–99)
Potassium: 4.1 mmol/L (ref 3.5–5.1)
Sodium: 132 mmol/L — ABNORMAL LOW (ref 135–145)
Total Bilirubin: 2.2 mg/dL — ABNORMAL HIGH (ref 0.3–1.2)
Total Protein: 6.3 g/dL — ABNORMAL LOW (ref 6.5–8.1)

## 2019-02-05 LAB — CBC
HCT: 36.7 % — ABNORMAL LOW (ref 39.0–52.0)
Hemoglobin: 12.6 g/dL — ABNORMAL LOW (ref 13.0–17.0)
MCH: 34.1 pg — ABNORMAL HIGH (ref 26.0–34.0)
MCHC: 34.3 g/dL (ref 30.0–36.0)
MCV: 99.2 fL (ref 80.0–100.0)
Platelets: 117 10*3/uL — ABNORMAL LOW (ref 150–400)
RBC: 3.7 MIL/uL — ABNORMAL LOW (ref 4.22–5.81)
RDW: 13.2 % (ref 11.5–15.5)
WBC: 5.7 10*3/uL (ref 4.0–10.5)
nRBC: 1.1 % — ABNORMAL HIGH (ref 0.0–0.2)

## 2019-02-05 LAB — BILIRUBIN, DIRECT: Bilirubin, Direct: 1 mg/dL — ABNORMAL HIGH (ref 0.0–0.2)

## 2019-02-05 LAB — GLUCOSE, CAPILLARY
Glucose-Capillary: 495 mg/dL — ABNORMAL HIGH (ref 70–99)
Glucose-Capillary: 332 mg/dL — ABNORMAL HIGH (ref 70–99)
Glucose-Capillary: 299 mg/dL — ABNORMAL HIGH (ref 70–99)
Glucose-Capillary: 284 mg/dL — ABNORMAL HIGH (ref 70–99)

## 2019-02-05 LAB — LIPASE, BLOOD: Lipase: 118 U/L — ABNORMAL HIGH (ref 11–51)

## 2019-02-05 MED ORDER — LORATADINE 10 MG PO TABS
10.0000 mg | ORAL_TABLET | Freq: Every day | ORAL | Status: DC
  Administered 2019-02-06 – 2019-02-07 (×2): 10 mg via ORAL
  Filled 2019-02-05 (×2): qty 1

## 2019-02-05 MED ORDER — INSULIN DETEMIR 100 UNIT/ML ~~LOC~~ SOLN
20.0000 [IU] | Freq: Every day | SUBCUTANEOUS | Status: DC
  Filled 2019-02-05: qty 0.2

## 2019-02-05 MED ORDER — DOCUSATE SODIUM 100 MG PO CAPS: 100.0000 mg | ORAL_CAPSULE | Freq: Two times a day (BID) | ORAL | Status: DC | PRN

## 2019-02-05 MED ORDER — ASPIRIN EC 81 MG PO TBEC
81.0000 mg | DELAYED_RELEASE_TABLET | Freq: Every day | ORAL | Status: DC
  Administered 2019-02-06 – 2019-02-07 (×2): 81 mg via ORAL
  Filled 2019-02-05 (×2): qty 1

## 2019-02-05 MED ORDER — INSULIN DETEMIR 100 UNIT/ML ~~LOC~~ SOLN
40.0000 [IU] | Freq: Every day | SUBCUTANEOUS | Status: DC
  Filled 2019-02-05: qty 0.4

## 2019-02-05 MED ORDER — INSULIN ASPART 100 UNIT/ML ~~LOC~~ SOLN
0.0000 [IU] | Freq: Three times a day (TID) | SUBCUTANEOUS | Status: DC
  Administered 2019-02-05: 8 [IU] via SUBCUTANEOUS
  Administered 2019-02-06 (×2): 5 [IU] via SUBCUTANEOUS
  Administered 2019-02-06: 15 [IU] via SUBCUTANEOUS
  Administered 2019-02-07 (×2): 3 [IU] via SUBCUTANEOUS
  Filled 2019-02-05 (×6): qty 1

## 2019-02-05 MED ORDER — HEPARIN SODIUM (PORCINE) 5000 UNIT/ML IJ SOLN
5000.0000 [IU] | Freq: Three times a day (TID) | INTRAMUSCULAR | Status: DC
  Administered 2019-02-05 – 2019-02-06 (×3): 5000 [IU] via SUBCUTANEOUS
  Filled 2019-02-05 (×3): qty 1

## 2019-02-05 MED ORDER — INSULIN ASPART 100 UNIT/ML ~~LOC~~ SOLN: 0.0000 [IU] | Freq: Every day | SUBCUTANEOUS | Status: DC

## 2019-02-05 MED ORDER — SERTRALINE HCL 50 MG PO TABS
100.0000 mg | ORAL_TABLET | Freq: Every day | ORAL | Status: DC
  Administered 2019-02-07: 100 mg via ORAL
  Filled 2019-02-05 (×2): qty 2

## 2019-02-05 MED ORDER — PANTOPRAZOLE SODIUM 40 MG PO TBEC
40.0000 mg | DELAYED_RELEASE_TABLET | Freq: Every day | ORAL | Status: DC
  Administered 2019-02-06 – 2019-02-07 (×2): 40 mg via ORAL
  Filled 2019-02-05 (×2): qty 1

## 2019-02-05 MED ORDER — SODIUM CHLORIDE 0.9 % IV BOLUS
1000.0000 mL | Freq: Once | INTRAVENOUS | Status: AC
  Administered 2019-02-05: 1000 mL via INTRAVENOUS

## 2019-02-05 MED ORDER — COLCHICINE 0.6 MG PO TABS
0.6000 mg | ORAL_TABLET | Freq: Every day | ORAL | Status: DC
  Administered 2019-02-06 – 2019-02-07 (×2): 0.6 mg via ORAL
  Filled 2019-02-05 (×2): qty 1

## 2019-02-05 MED ORDER — INSULIN ASPART 100 UNIT/ML ~~LOC~~ SOLN: 0.0000 [IU] | Freq: Three times a day (TID) | SUBCUTANEOUS | Status: DC

## 2019-02-05 MED ORDER — INSULIN ASPART 100 UNIT/ML ~~LOC~~ SOLN
0.0000 [IU] | Freq: Every day | SUBCUTANEOUS | Status: DC
  Administered 2019-02-05: 3 [IU] via SUBCUTANEOUS
  Filled 2019-02-05: qty 1

## 2019-02-05 MED ORDER — SODIUM CHLORIDE 0.9 % IV SOLN
INTRAVENOUS | Status: DC
  Administered 2019-02-05 – 2019-02-06 (×2): via INTRAVENOUS

## 2019-02-05 MED ORDER — LISINOPRIL 5 MG PO TABS
5.0000 mg | ORAL_TABLET | Freq: Every day | ORAL | Status: DC
  Administered 2019-02-07: 5 mg via ORAL
  Filled 2019-02-05: qty 1

## 2019-02-05 MED ORDER — GABAPENTIN 300 MG PO CAPS
300.0000 mg | ORAL_CAPSULE | ORAL | Status: DC | PRN
  Administered 2019-02-06 – 2019-02-07 (×2): 300 mg via ORAL
  Filled 2019-02-05 (×2): qty 1

## 2019-02-05 NOTE — ED Provider Notes (Signed)
[facility] Emergency Department Provider Note  ____________________________________________   First MD Initiated Contact with Patient 02/05/19 1457     (approximate)  I have reviewed the triage vital signs and the nursing notes.   HISTORY  Chief Complaint Abnormal Lab    HPI Dennis Rowland. is a 58 y.o. male with diabetes, alcohol use 2- 40 ounces daily, no history of withdrawal who presents with abnormal lab.   Patient reports he was called by [facility] regarding blood work done yesterday.  AST - 4124 ALT - 2874  Patient reports that he is supposed be taking 40 units of insulin at night and metformin twice a day.  He occasionally adjust his insulin to 10 to 20 units depending on what his sugars were during the day.  He says that he actually missed his dose last night due to feeling weak.  He also missed his metformin last night and this morning.   He endorses daily alcohol use but no history of withdrawal.  No excessive Tylenol use.  Denies any recent travel or [place].  He does have some upper abdominal pain that is intermittent.  None too much currently.  Nothing makes it better, nothing makes it worse.  He does endorse some increased thirst secondary to the high sugar.    Past Medical History:  Diagnosis Date  . Arthritis   . Chronic sniffling   . Diabetes ([place])   . Dyspnea    some doe  . GERD (gastroesophageal reflux disease)   . Gout   . Hyperlipidemia   . Hypertension   . Neuropathy     Patient Active Problem List   Diagnosis Date Noted  . Neck pain 11/20/2018  . Numbness and tingling in both hands 11/20/2018  . Unilateral inguinal hernia without obstruction or gangrene 05/16/2018    Past Surgical History:  Procedure Laterality Date  . NO PAST SURGERIES      Prior to Admission medications   Medication Sig Start Date End Date Taking? Authorizing Provider  aspirin EC 81 MG tablet Take 81 mg by mouth daily.      [provider]  cetirizine (ZYRTEC) 10 MG tablet Take 10 mg by mouth daily.     [provider]  chlorhexidine (PERIDEX) 0.12 % solution Use as directed 15 mLs in the mouth or throat daily.  03/13/18   [provider]  colchicine 0.6 MG tablet Take 0.6 mg by mouth daily.     [provider]  gabapentin (NEURONTIN) 300 MG capsule Take 300 mg by mouth every 4 (four) hours as needed (for pain).     [provider]  HYDROcodone-acetaminophen (NORCO/VICODIN) 5-325 MG tablet TAKE 1 TABLET BY MOUTH EVERY 4 TO 6 HOURS AS NEEDED FOR PAIN 06/12/18   [provider]  indomethacin (INDOCIN) 50 MG capsule Take 50 mg by mouth daily.     [provider]  Insulin Detemir (LEVEMIR FLEXTOUCH) 100 UNIT/ML Pen Inject 40 Units into the skin at bedtime.     [provider]  lisinopril (PRINIVIL,ZESTRIL) 5 MG tablet Take 5 mg by mouth daily.  03/02/15   [provider]  meloxicam (MOBIC) 15 MG tablet Take 1 tablet (15 mg total) by mouth daily. 03/25/18   Gardiner Barefoot, DPM  metFORMIN (GLUCOPHAGE) 500 MG tablet Take 500 mg by mouth 2 (two) times daily with a meal.     [provider]  mupirocin cream (BACTROBAN) 2 % Apply to affected  area 3 times daily 09/27/18 09/27/19  Arta Silence, MD  pantoprazole (PROTONIX) 40 MG tablet Take 40 mg by mouth daily.     [provider]  sertraline (ZOLOFT) 100 MG tablet Take 100 mg by mouth daily.    [provider]  simvastatin (ZOCOR) 20 MG tablet Take 20 mg by mouth daily.     [provider]    Allergies Tape, Dye fdc red [red dye], and Hydrochlorothiazide w-triamterene  No family history on file.  Social History Social History   Tobacco Use  . Smoking status: Current Every Day Smoker  . Smokeless tobacco: Never Used  Substance Use Topics  . Alcohol use: Yes    Alcohol/week: 6.0 standard drinks    Types: 6 Cans of beer per week    Comment: DAILY   . Drug use: Yes    Types: Cocaine, Marijuana      Review of Systems Constitutional: No fever/chills, increased thirst Eyes: No visual changes. ENT: No sore throat. Cardiovascular: Denies chest pain. Respiratory: Denies shortness of breath. Gastrointestinal: Upper abdominal pain.  No nausea, no vomiting.  No diarrhea.  No constipation. Genitourinary: Negative for dysuria. Musculoskeletal: Negative for back pain. Skin: Negative for rash. Neurological: Negative for headaches, focal weakness or numbness. All other ROS negative ____________________________________________   PHYSICAL EXAM:  VITAL SIGNS: ED Triage Vitals  Enc Vitals Group     BP 02/05/19 1200 (!) 123/91     Pulse Rate 02/05/19 1200 95     Resp 02/05/19 1200 19     Temp 02/05/19 1200 98.8 F (37.1 C)     Temp Source 02/05/19 1200 Oral     SpO2 02/05/19 1200 99 %     Weight 02/05/19 1158 143 lb (64.9 kg)     Height 02/05/19 1158 5\' 6"  (1.676 m)     Head Circumference --      Peak Flow --      Pain Score 02/05/19 1204 4     Pain Loc --      Pain Edu? --      Excl. in [place]? --     Constitutional: Alert and oriented. Well appearing and in no acute distress. Eyes: Conjunctivae are normal. EOMI. Head: Atraumatic. Nose: No congestion/rhinnorhea. Mouth/Throat: Mucous membranes are moist.   Neck: No stridor. Trachea Midline. FROM Cardiovascular: Normal rate, regular rhythm. Grossly normal heart sounds.  Good peripheral circulation. Respiratory: Normal respiratory effort.  No retractions. Lungs CTAB. Gastrointestinal: Soft with slight upper abdominal tenderness.  No distention. No abdominal bruits.  Musculoskeletal: No lower extremity tenderness nor edema.  No joint effusions. Neurologic:  Normal speech and language. No gross focal neurologic deficits are appreciated.  Skin:  Skin is warm, dry and intact. No rash noted. Psychiatric: Mood and affect are normal. Speech and behavior are normal. GU: Deferred    ____________________________________________   LABS (all labs ordered are listed, but only abnormal results are displayed)  Labs Reviewed  GLUCOSE, CAPILLARY - Abnormal; Notable for the following components:      Result Value   Glucose-Capillary 495 (*)    All other components within normal limits  LIPASE, BLOOD - Abnormal; Notable for the following components:   Lipase 118 (*)    All other components within normal limits  COMPREHENSIVE METABOLIC PANEL - Abnormal; Notable for the following components:   Sodium 132 (*)    Chloride 95 (*)    Glucose, Bld 551 (*)    Calcium 8.5 (*)    Total  Protein 6.3 (*)    Albumin 3.4 (*)    AST 2,910 (*)    ALT 3,930 (*)    Total Bilirubin 2.2 (*)    All other components within normal limits  CBC - Abnormal; Notable for the following components:   RBC 3.70 (*)    Hemoglobin 12.6 (*)    HCT 36.7 (*)    MCH 34.1 (*)    Platelets 117 (*)    nRBC 1.1 (*)    All other components within normal limits  URINALYSIS, COMPLETE (UACMP) WITH MICROSCOPIC - Abnormal; Notable for the following components:   Color, Urine YELLOW (*)    APPearance CLEAR (*)    Specific Gravity, Urine 1.031 (*)    Glucose, UA >=500 (*)    All other components within normal limits  BILIRUBIN, DIRECT - Abnormal; Notable for the following components:   Bilirubin, Direct 1.0 (*)    All other components within normal limits  CBG MONITORING, ED   ____________________________________________ RADIOLOGY  Official radiology report(s): US Abdomen Limited Ruq  Result Date: 02/05/2019 CLINICAL DATA:  Right upper quadrant pain for 2 days. Elevated LFTs and lipase. EXAM: ULTRASOUND ABDOMEN LIMITED RIGHT UPPER QUADRANT COMPARISON:  None. FINDINGS: Gallbladder: There is diffuse gallbladder wall thickening measuring up to 7 mm. No gallstones or sludge. No sonographic Murphy sign noted by sonographer. Common bile duct: Diameter: 4 mm Liver: No focal lesion identified. Within normal  limits in parenchymal echogenicity. Portal vein is patent on color Doppler imaging with normal direction of blood flow towards the liver. Other: None. IMPRESSION: Diffuse gallbladder wall thickening which alone is a nonspecific finding and can occur in the setting of gallbladder inflammation, or secondary to hepatitis, volume overload or hypoalbuminemia among others. Electronically Signed   By: Audie Pinto M.D.   On: 02/05/2019 16:35    ____________________________________________   PROCEDURES  Procedure(s) performed (including Critical Care):  Procedures   ____________________________________________   INITIAL IMPRESSION / ASSESSMENT AND PLAN / ED COURSE  Munachimso Romack. was evaluated in Emergency Department on 02/05/2019 for the symptoms described in the history of present illness. He was evaluated in the context of the global COVID-19 pandemic, which necessitated consideration that the patient might be at risk for infection with the SARS-CoV-2 virus that causes COVID-19. Institutional protocols and algorithms that pertain to the evaluation of patients at risk for COVID-19 are in a state of rapid change based on information released by regulatory bodies including the CDC and federal and state organizations. These policies and algorithms were followed during the patient's care in the ED.    Patient is a 58 year old daily alcohol user and diabetic who presents with elevated LFTs and glucose.  Will get labs evaluate for DKA.  Patient's LFTs are ALT: AST 2-1 which makes alcohol use less likely.  Consider viral infection.  Will get ultrasound to evaluate for choledocholithiasis given elevated lipase and patient does have intermittent abdominal pain.  Will evaluate for electrolyte abnormalities   LFTs are elevated at 2910 and 3930.  Lipase is 118.  Glucose need to be 551 but normal anion gap.  UA without evidence of UTI.   3:13 PM repeat sugar 332  Ultrasound no evidence of  choledocholithiasis.  Discussed with hospital team for admission given elevated LFTs and uncontrolled hyperglycemia.  Patient started on sliding scale insulin and given he wants to eat dinner.  They will admit patient to hospital.   ____________________________________________   FINAL CLINICAL IMPRESSION(S) / ED DIAGNOSES  Final diagnoses:  RUQ pain  Elevated LFTs  Hyperglycemia      MEDICATIONS GIVEN DURING THIS VISIT:  Medications  insulin aspart (novoLOG) injection 0-15 Units (has no administration in time range)  insulin aspart (novoLOG) injection 0-5 Units (has no administration in time range)  sodium chloride 0.9 % bolus 1,000 mL (0 mLs Intravenous Stopped 02/05/19 1420)  sodium chloride 0.9 % bolus 1,000 mL (1,000 mLs Intravenous New Bag/Given 02/05/19 1522)     ED Discharge Orders    None       Note:  This document was prepared using Dragon voice recognition software and may include unintentional dictation errors.   Vanessa Custer, MD 02/05/19 [phone]

## 2019-02-05 NOTE — ED Triage Notes (Signed)
Patient reports he was called by [facility] regarding blood work done yesterday.   AST - 4124 ALT - 2874  Patient denies any abdominal pain, N/V/D.  Patient does reports increased thirst. Hx DM.

## 2019-02-05 NOTE — ED Notes (Signed)
Pt given lunch tray per Dr. Jari Pigg. CBG checked and given insulin per order.

## 2019-02-05 NOTE — ED Notes (Signed)
When inquiring about uncontrolled blood sugar, pt states, "Yeah, I take insulin at night, Im supposed to take 40 but I only take 10-20."    Pt also reports drinking "two 40's a day."

## 2019-02-05 NOTE — H&P (Signed)
[facility] [place] NAME: Dennis Rowland    MR#:  [id number]  DATE OF BIRTH:  Mar 31, 1961  DATE OF ADMISSION:  02/05/2019  PRIMARY CARE PHYSICIAN: [facility] [place]   REQUESTING/REFERRING PHYSICIAN: Jari Pigg  CHIEF COMPLAINT:   Chief Complaint  Patient presents with  . Abnormal Lab    HISTORY OF PRESENT ILLNESS: Dennis Rowland  is a 58 y.o. male with a known history of diabetes, hyperlipidemia, hypertension, neuropathy, chronic alcoholism-went for his regular physical checkup yesterday and had blood work done.  Today he got a call from doctor's office that his liver enzymes were significantly high and he need to go to emergency room right away. Patient denies any complaints of nausea, abdominal pain, change in the urine or stool colors, indigestion of bloating sensation, fever chills or leg edema. LFTs were noted in the range of 2000 and 3000 so advised to admit to hospitalist service. His lipase is also high but patient does not have any abdominal pain complaints.  PAST MEDICAL HISTORY:   Past Medical History:  Diagnosis Date  . Arthritis   . Chronic sniffling   . Diabetes ([place])   . Dyspnea    some doe  . GERD (gastroesophageal reflux disease)   . Gout   . Hyperlipidemia   . Hypertension   . Neuropathy     PAST SURGICAL HISTORY:  Past Surgical History:  Procedure Laterality Date  . NO PAST SURGERIES      SOCIAL HISTORY:  Social History   Tobacco Use  . Smoking status: Current Every Day Smoker  . Smokeless tobacco: Never Used  Substance Use Topics  . Alcohol use: Yes    Alcohol/week: 6.0 standard drinks    Types: 6 Cans of beer per week    Comment: DAILY    FAMILY HISTORY:  Family History  Problem Relation Age of Onset  . Hypertension Mother     DRUG ALLERGIES:  Allergies  Allergen Reactions  . Tape Anaphylaxis  . Dye Fdc Red [Red Dye] Nausea And Vomiting  . Hydrochlorothiazide W-Triamterene Other  (See Comments)    Unknown    REVIEW OF SYSTEMS:   CONSTITUTIONAL: No fever, fatigue or weakness.  EYES: No blurred or double vision.  EARS, NOSE, AND THROAT: No tinnitus or ear pain.  RESPIRATORY: No cough, shortness of breath, wheezing or hemoptysis.  CARDIOVASCULAR: No chest pain, orthopnea, edema.  GASTROINTESTINAL: No nausea, vomiting, diarrhea or abdominal pain.  GENITOURINARY: No dysuria, hematuria.  ENDOCRINE: No polyuria, nocturia,  HEMATOLOGY: No anemia, easy bruising or bleeding SKIN: No rash or lesion. MUSCULOSKELETAL: No joint pain or arthritis.   NEUROLOGIC: No tingling, numbness, weakness.  PSYCHIATRY: No anxiety or depression.   MEDICATIONS AT HOME:  Prior to Admission medications   Medication Sig Start Date End Date Taking? Authorizing Provider  Insulin Detemir (LEVEMIR FLEXTOUCH) 100 UNIT/ML Pen Inject 40 Units into the skin at bedtime.    Yes [provider]  metFORMIN (GLUCOPHAGE) 500 MG tablet Take 500 mg by mouth 2 (two) times daily with a meal.    Yes [provider]  aspirin EC 81 MG tablet Take 81 mg by mouth daily.     [provider]  cetirizine (ZYRTEC) 10 MG tablet Take 10 mg by mouth daily.     [provider]  chlorhexidine (PERIDEX) 0.12 % solution Use as directed 15 mLs in the mouth or throat daily.  03/13/18   [provider]  colchicine  0.6 MG tablet Take 0.6 mg by mouth daily.     [provider]  gabapentin (NEURONTIN) 300 MG capsule Take 300 mg by mouth every 4 (four) hours as needed (for pain).     [provider]  HYDROcodone-acetaminophen (NORCO/VICODIN) 5-325 MG tablet TAKE 1 TABLET BY MOUTH EVERY 4 TO 6 HOURS AS NEEDED FOR PAIN 06/12/18   [provider]  indomethacin (INDOCIN) 50 MG capsule Take 50 mg by mouth daily.     [provider]  lisinopril (PRINIVIL,ZESTRIL) 5 MG tablet Take 5 mg by mouth daily.  03/02/15   [provider]  meloxicam (MOBIC) 15  MG tablet Take 1 tablet (15 mg total) by mouth daily. 03/25/18   Gardiner Barefoot, DPM  mupirocin cream Drue Stager) 2 % Apply to affected area 3 times daily 09/27/18 09/27/19  Arta Silence, MD  pantoprazole (PROTONIX) 40 MG tablet Take 40 mg by mouth daily.     [provider]  sertraline (ZOLOFT) 100 MG tablet Take 100 mg by mouth daily.    [provider]  simvastatin (ZOCOR) 20 MG tablet Take 20 mg by mouth daily.     [provider]      PHYSICAL EXAMINATION:   VITAL SIGNS: Blood pressure 128/87, pulse 86, temperature 98.8 F (37.1 C), temperature source Oral, resp. rate 18, height 5\' 6"  (1.676 m), weight 64.9 kg, SpO2 98 %.  GENERAL:  58 y.o.-year-old patient lying in the bed with no acute distress.  EYES: Pupils equal, round, reactive to light and accommodation. No scleral icterus. Extraocular muscles intact.  Icterus present. HEENT: Head atraumatic, normocephalic. Oropharynx and nasopharynx clear.  NECK:  Supple, no jugular venous distention. No thyroid enlargement, no tenderness.  LUNGS: Normal breath sounds bilaterally, no wheezing, rales,rhonchi or crepitation. No use of accessory muscles of respiration.  CARDIOVASCULAR: S1, S2 normal. No murmurs, rubs, or gallops.  ABDOMEN: Soft, nontender, nondistended. Bowel sounds present. No organomegaly or mass.  EXTREMITIES: No pedal edema, cyanosis, or clubbing.  NEUROLOGIC: Cranial nerves II through XII are intact. Muscle strength 5/5 in all extremities. Sensation intact. Gait not checked.  PSYCHIATRIC: The patient is alert and oriented x 3.  SKIN: No obvious rash, lesion, or ulcer.   LABORATORY PANEL:   CBC Recent Labs  Lab 02/05/19 1209  WBC 5.7  HGB 12.6*  HCT 36.7*  PLT 117*  MCV 99.2  MCH 34.1*  MCHC 34.3  RDW 13.2   ------------------------------------------------------------------------------------------------------------------  Chemistries  Recent Labs  Lab 02/05/19 1209  NA 132*   K 4.1  CL 95*  CO2 26  GLUCOSE 551*  BUN 8  CREATININE 0.82  CALCIUM 8.5*  AST 2,910*  ALT 3,930*  ALKPHOS 70  BILITOT 2.2*   ------------------------------------------------------------------------------------------------------------------ estimated creatinine clearance is 90.8 mL/min (by C-G formula based on SCr of 0.82 mg/dL). ------------------------------------------------------------------------------------------------------------------ No results for input(s): TSH, T4TOTAL, T3FREE, THYROIDAB in the last 72 hours.  Invalid input(s): FREET3   Coagulation profile No results for input(s): INR, PROTIME in the last 168 hours. ------------------------------------------------------------------------------------------------------------------- No results for input(s): DDIMER in the last 72 hours. -------------------------------------------------------------------------------------------------------------------  Cardiac Enzymes No results for input(s): CKMB, TROPONINI, MYOGLOBIN in the last 168 hours.  Invalid input(s): CK ------------------------------------------------------------------------------------------------------------------ Invalid input(s): POCBNP  ---------------------------------------------------------------------------------------------------------------  Urinalysis    Component Value Date/Time   COLORURINE YELLOW (A) 02/05/2019 1209   APPEARANCEUR CLEAR (A) 02/05/2019 1209   APPEARANCEUR Clear 11/23/2013 0538   LABSPEC 1.031 (H) 02/05/2019 1209   LABSPEC 1.006 11/23/2013 0538   PHURINE 6.0  02/05/2019 1209   GLUCOSEU >=500 (A) 02/05/2019 1209   GLUCOSEU Negative 11/23/2013 0538   HGBUR NEGATIVE 02/05/2019 1209   BILIRUBINUR NEGATIVE 02/05/2019 1209   BILIRUBINUR Negative 11/23/2013 [place] 02/05/2019 1209   PROTEINUR NEGATIVE 02/05/2019 1209   NITRITE NEGATIVE 02/05/2019 [place] 02/05/2019 1209   LEUKOCYTESUR  Negative 11/23/2013 0538     RADIOLOGY: US Abdomen Limited Ruq  Result Date: 02/05/2019 CLINICAL DATA:  Right upper quadrant pain for 2 days. Elevated LFTs and lipase. EXAM: ULTRASOUND ABDOMEN LIMITED RIGHT UPPER QUADRANT COMPARISON:  None. FINDINGS: Gallbladder: There is diffuse gallbladder wall thickening measuring up to 7 mm. No gallstones or sludge. No sonographic Murphy sign noted by sonographer. Common bile duct: Diameter: 4 mm Liver: No focal lesion identified. Within normal limits in parenchymal echogenicity. Portal vein is patent on color Doppler imaging with normal direction of blood flow towards the liver. Other: None. IMPRESSION: Diffuse gallbladder wall thickening which alone is a nonspecific finding and can occur in the setting of gallbladder inflammation, or secondary to hepatitis, volume overload or hypoalbuminemia among others. Electronically Signed   By: Audie Pinto M.D.   On: 02/05/2019 16:35    EKG: Orders placed or performed during the hospital encounter of 09/27/18  . EKG 12-Lead  . EKG 12-Lead  . EKG 12-Lead  . EKG 12-Lead  . EKG    IMPRESSION AND PLAN:  *Elevated liver enzymes This is secondary to possible alcoholism He also uses simvastatin at home. I will hold simvastatin. Right upper quadrant ultrasound did not show any obstruction or acute cholecystitis. Patient does not have any tenderness in the right upper quadrant. I will check hepatitis panel, coagulation profile and call GI consult  *Hyperglycemia with diabetes Currently I will give insulin sliding scale coverage along with Levemir home dose and continue following blood sugars closely. On presentation his blood sugar was more than 500 but it came down up to 300 later.  *Elevated lipase Patient does not have any epigastric or right upper quadrant tenderness. Continue to monitor for any further symptoms.  *Hypertension Continue lisinopril.  *Hyperlipidemia Hold simvastatin for  now.  *Gastroesophageal reflux disease Continue pantoprazole.  *Active smoking Counseled to quit smoking for 4 minutes and offered nicotine patch.  All the records are reviewed and case discussed with ED provider. Management plans discussed with the patient, family and they are in agreement.  CODE STATUS: Full code    TOTAL TIME TAKING CARE OF THIS PATIENT: 45 minutes.    Vaughan Basta M.D on 02/05/2019   Between 7am to 6pm - Pager - [phone]  After 6pm go to [email or web address] - password [email or web address] [facility]  Office  [phone]  CC: Primary care physician; [facility]   Note: This dictation was prepared with Dragon dictation along with smaller phrase technology. Any transcriptional errors that result from this process are unintentional.

## 2019-02-05 NOTE — Plan of Care (Signed)

## 2019-02-05 NOTE — ED Notes (Signed)
Tom RN, aware of bed assigned  

## 2019-02-05 NOTE — Progress Notes (Signed)
Patient arrived from ED. A&O, VSS, no complaints of pain. IVF infusing. Patient in bed, lowest position. Call bell in reach. No complaints at this time, continue to monitor.

## 2019-02-05 NOTE — ED Notes (Signed)
Pt assisted to toilet 

## 2019-02-05 NOTE — ED Notes (Addendum)
ED TO INPATIENT HANDOFF REPORT  ED Nurse Name and Phone #:   Gershon Mussel RN   [id number]  S Name/Age/Gender Dennis Rowland. 58 y.o. male Room/Bed: ED05HA/ED05HA  Code Status   Code Status: Not on file  Home/SNF/Other Home Patient oriented to: self, place, time and situation Is this baseline? No   Triage Complete: Triage complete  Chief Complaint abnormal labs  Triage Note Patient reports he was called by [facility] regarding blood work done yesterday.   AST - 4124 ALT - 2874  Patient denies any abdominal pain, N/V/D.  Patient does reports increased thirst. Hx DM.    Allergies Allergies  Allergen Reactions  . Tape Anaphylaxis  . Dye Fdc Red [Red Dye] Nausea And Vomiting  . Hydrochlorothiazide W-Triamterene Other (See Comments)    Unknown    Level of Care/Admitting Diagnosis ED Disposition    ED Disposition Condition [facility]: [place] [[id number]]  Level of Care: Med-Surg [16]  Covid Evaluation: Asymptomatic Screening Protocol (No Symptoms)  Diagnosis: Elevated LFTs [321910]  Admitting Physician: Vaughan Basta [id number]  Attending Physician: Vaughan Basta [phone]  Estimated length of stay: past midnight tomorrow  Certification:: I certify this patient will need inpatient services for at least 2 midnights  PT Class (Do Not Modify): Inpatient [101]  PT Acc Code (Do Not Modify): Private [1]       B Medical/Surgery History Past Medical History:  Diagnosis Date  . Arthritis   . Chronic sniffling   . Diabetes ([place])   . Dyspnea    some doe  . GERD (gastroesophageal reflux disease)   . Gout   . Hyperlipidemia   . Hypertension   . Neuropathy    Past Surgical History:  Procedure Laterality Date  . NO PAST SURGERIES       A IV Location/Drains/Wounds Patient Lines/Drains/Airways Status   Active Line/Drains/Airways    Name:   Placement date:   Placement time:   Site:   Days:   Peripheral  IV 02/05/19 Right Forearm   02/05/19    1308    Forearm   less than 1          Intake/Output Last 24 hours  Intake/Output Summary (Last 24 hours) at 02/05/2019 2010 Last data filed at 02/05/2019 1420 Gross per 24 hour  Intake 1000 ml  Output -  Net 1000 ml    Labs/Imaging Results for orders placed or performed during the hospital encounter of 02/05/19 (from the past 48 hour(s))  Glucose, capillary     Status: Abnormal   Collection Time: 02/05/19 12:05 PM  Result Value Ref Range   Glucose-Capillary 495 (H) 70 - 99 mg/dL  Lipase, blood     Status: Abnormal   Collection Time: 02/05/19 12:09 PM  Result Value Ref Range   Lipase 118 (H) 11 - 51 U/L    Comment: Performed at [facility], [address] [place], [place] [address]  Comprehensive metabolic panel     Status: Abnormal   Collection Time: 02/05/19 12:09 PM  Result Value Ref Range   Sodium 132 (L) 135 - 145 mmol/L   Potassium 4.1 3.5 - 5.1 mmol/L   Chloride 95 (L) 98 - 111 mmol/L   CO2 26 22 - 32 mmol/L   Glucose, Bld 551 (HH) 70 - 99 mg/dL    Comment: CRITICAL RESULT CALLED TO, READ BACK BY AND VERIFIED WITH COLLYN GILLESPIE AT 1259 02/05/2019 KMP/JJB    BUN 8  6 - 20 mg/dL   Creatinine, Ser 0.82 0.61 - 1.24 mg/dL   Calcium 8.5 (L) 8.9 - 10.3 mg/dL   Total Protein 6.3 (L) 6.5 - 8.1 g/dL   Albumin 3.4 (L) 3.5 - 5.0 g/dL   AST 2,910 (H) 15 - 41 U/L    Comment: RESULT CONFIRMED BY MANUAL DILUTION KMP   ALT 3,930 (H) 0 - 44 U/L    Comment: RESULT CONFIRMED BY MANUAL DILUTION KMP   Alkaline Phosphatase 70 38 - 126 U/L   Total Bilirubin 2.2 (H) 0.3 - 1.2 mg/dL   GFR calc non Af Amer >60 >60 mL/min   GFR calc Af Amer >60 >60 mL/min   Anion gap 11 5 - 15    Comment: Performed at [facility], [address] [place], [place] [address]  CBC     Status: Abnormal   Collection Time: 02/05/19 12:09 PM  Result Value Ref Range   WBC 5.7 4.0 - 10.5 K/uL   RBC 3.70 (L) 4.22 - 5.81 MIL/uL   Hemoglobin 12.6  (L) 13.0 - 17.0 g/dL   HCT 36.7 (L) 39.0 - 52.0 %   MCV 99.2 80.0 - 100.0 fL   MCH 34.1 (H) 26.0 - 34.0 pg   MCHC 34.3 30.0 - 36.0 g/dL   RDW 13.2 11.5 - 15.5 %   Platelets 117 (L) 150 - 400 K/uL    Comment: Immature Platelet Fraction may be clinically indicated, consider ordering this additional test [id number]    nRBC 1.1 (H) 0.0 - 0.2 %    Comment: Performed at [facility], [address] [place], [place] [address]  Urinalysis, Complete w Microscopic     Status: Abnormal   Collection Time: 02/05/19 12:09 PM  Result Value Ref Range   Color, Urine YELLOW (A) YELLOW   APPearance CLEAR (A) CLEAR   Specific Gravity, Urine 1.031 (H) 1.005 - 1.030   pH 6.0 5.0 - 8.0   Glucose, UA >=500 (A) NEGATIVE mg/dL   Hgb urine dipstick NEGATIVE NEGATIVE   Bilirubin Urine NEGATIVE NEGATIVE   Ketones, ur NEGATIVE NEGATIVE mg/dL   Protein, ur NEGATIVE NEGATIVE mg/dL   Nitrite NEGATIVE NEGATIVE   Leukocytes,Ua NEGATIVE NEGATIVE   RBC / HPF 0-5 0 - 5 RBC/hpf   WBC, UA 0-5 0 - 5 WBC/hpf   Bacteria, UA NONE SEEN NONE SEEN   Squamous Epithelial / LPF 0-5 0 - 5    Comment: Performed at [facility], [address] [place], [place] [address]  Bilirubin, direct     Status: Abnormal   Collection Time: 02/05/19 12:09 PM  Result Value Ref Range   Bilirubin, Direct 1.0 (H) 0.0 - 0.2 mg/dL    Comment: Performed at [facility], [address] [place],  [address]  Glucose, capillary     Status: Abnormal   Collection Time: 02/05/19  3:12 PM  Result Value Ref Range   Glucose-Capillary 332 (H) 70 - 99 mg/dL  Glucose, capillary     Status: Abnormal   Collection Time: 02/05/19  5:09 PM  Result Value Ref Range   Glucose-Capillary 299 (H) 70 - 99 mg/dL   US Abdomen Limited Ruq  Result Date: 02/05/2019 CLINICAL DATA:  Right upper quadrant pain for 2 days. Elevated LFTs and lipase. EXAM: ULTRASOUND ABDOMEN LIMITED RIGHT UPPER QUADRANT COMPARISON:  None. FINDINGS:  Gallbladder: There is diffuse gallbladder wall thickening measuring up to 7 mm. No gallstones or sludge. No sonographic Murphy sign noted by  sonographer. Common bile duct: Diameter: 4 mm Liver: No focal lesion identified. Within normal limits in parenchymal echogenicity. Portal vein is patent on color Doppler imaging with normal direction of blood flow towards the liver. Other: None. IMPRESSION: Diffuse gallbladder wall thickening which alone is a nonspecific finding and can occur in the setting of gallbladder inflammation, or secondary to hepatitis, volume overload or hypoalbuminemia among others. Electronically Signed   By: Audie Pinto M.D.   On: 02/05/2019 16:35    Pending Labs Unresulted Labs (From admission, onward)    Start     Ordered   02/06/19 0500  Comprehensive metabolic panel  Tomorrow morning,   STAT     02/05/19 1705   02/06/19 0500  Protime-INR  Tomorrow morning,   STAT     02/05/19 1841   02/06/19 0500  CBC  Tomorrow morning,   STAT     02/05/19 1842   02/05/19 1706  Hepatitis panel, acute  Add-on,   AD     02/05/19 1705   02/05/19 1649  Hemoglobin A1c  Add-on,   AD    Comments: To assess prior glycemic control    02/05/19 1649   02/05/19 1647  SARS CORONAVIRUS 2 (TAT 6-24 HRS) Nasopharyngeal Nasopharyngeal Swab  (Asymptomatic/Tier 2 Patients Labs)  Once,   STAT    Question Answer Comment  Is this test for diagnosis or screening Screening   Symptomatic for COVID-19 as defined by CDC No   Hospitalized for COVID-19 No   Admitted to ICU for COVID-19 No   Previously tested for COVID-19 No   Resident in a congregate (group) care setting No   Employed in healthcare setting No      02/05/19 1647   Signed and Held  HIV Antibody  (Routine Testing)  Once,   R     Signed and Held   Signed and Held  Basic metabolic panel  Tomorrow morning,   R     Signed and Held   Signed and Held  CBC  Tomorrow morning,   R     Signed and Held   Signed and Held  CBC  (heparin)  Once,   R     Comments: Baseline for heparin therapy IF NOT ALREADY DRAWN.  Notify MD if PLT < 100 K.    Signed and Held   Signed and Held  Creatinine, serum  (heparin)  Once,   R    Comments: Baseline for heparin therapy IF NOT ALREADY DRAWN.    Signed and Held          Vitals/Pain Today's Vitals   02/05/19 1435 02/05/19 1642 02/05/19 1650 02/05/19 2008  BP: 124/84 128/87 128/87 138/76  Pulse: 79  86 87  Resp: 18  18 18   Temp:    98.4 F (36.9 C)  TempSrc:    Oral  SpO2: 100%  98% 100%  Weight:      Height:      PainSc: 0-No pain  0-No pain 0-No pain    Isolation Precautions No active isolations  Medications Medications  insulin aspart (novoLOG) injection 0-15 Units (8 Units Subcutaneous Given 02/05/19 1718)  0.9 %  sodium chloride infusion (has no administration in time range)  insulin aspart (novoLOG) injection 0-5 Units (has no administration in time range)  sodium chloride 0.9 % bolus 1,000 mL (0 mLs Intravenous Stopped 02/05/19 1420)  sodium chloride 0.9 % bolus 1,000 mL (0 mLs Intravenous Stopped 02/05/19 1704)    Mobility walks Low  fall risk   Focused Assessments Cardiac Assessment Handoff:    Lab Results  Component Value Date   [id number] (H) 08/22/2012   CKMB 3.1 08/22/2012   TROPONINI <0.03 09/27/2018   No results found for: DDIMER Does the Patient currently have chest pain? No     R Recommendations: See Admitting Provider Note  Report given to:  Estill Bamberg RN on 1A  Additional Notes:

## 2019-02-06 DIAGNOSIS — B179 Acute viral hepatitis, unspecified: Principal | ICD-10-CM

## 2019-02-06 DIAGNOSIS — R945 Abnormal results of liver function studies: Secondary | ICD-10-CM

## 2019-02-06 LAB — PROTIME-INR
INR: 2.1 — ABNORMAL HIGH (ref 0.8–1.2)
Prothrombin Time: 23.4 seconds — ABNORMAL HIGH (ref 11.4–15.2)

## 2019-02-06 LAB — URINE DRUG SCREEN, QUALITATIVE (ARMC ONLY)
Amphetamines, Ur Screen: NOT DETECTED
Barbiturates, Ur Screen: NOT DETECTED
Benzodiazepine, Ur Scrn: NOT DETECTED
Cannabinoid 50 Ng, Ur ~~LOC~~: NOT DETECTED
Cocaine Metabolite,Ur ~~LOC~~: NOT DETECTED
MDMA (Ecstasy)Ur Screen: NOT DETECTED
Methadone Scn, Ur: NOT DETECTED
Opiate, Ur Screen: NOT DETECTED
Phencyclidine (PCP) Ur S: NOT DETECTED
Tricyclic, Ur Screen: NOT DETECTED

## 2019-02-06 LAB — HEMOGLOBIN A1C
Hgb A1c MFr Bld: 10.2 % — ABNORMAL HIGH (ref 4.8–5.6)
Mean Plasma Glucose: 246 mg/dL

## 2019-02-06 LAB — COMPREHENSIVE METABOLIC PANEL
ALT: 2212 U/L — ABNORMAL HIGH (ref 0–44)
AST: 824 U/L — ABNORMAL HIGH (ref 15–41)
Albumin: 2.6 g/dL — ABNORMAL LOW (ref 3.5–5.0)
Alkaline Phosphatase: 59 U/L (ref 38–126)
Anion gap: 7 (ref 5–15)
BUN: <5 mg/dL — ABNORMAL LOW (ref 6–20)
CO2: 25 mmol/L (ref 22–32)
Calcium: 7.5 mg/dL — ABNORMAL LOW (ref 8.9–10.3)
Chloride: 105 mmol/L (ref 98–111)
Creatinine, Ser: 0.49 mg/dL — ABNORMAL LOW (ref 0.61–1.24)
GFR calc Af Amer: >60 mL/min (ref >60)
GFR calc non Af Amer: >60 mL/min (ref >60)
Glucose, Bld: 273 mg/dL — ABNORMAL HIGH (ref 70–99)
Potassium: 3.8 mmol/L (ref 3.5–5.1)
Sodium: 137 mmol/L (ref 135–145)
Total Bilirubin: 2.5 mg/dL — ABNORMAL HIGH (ref 0.3–1.2)
Total Protein: 4.9 g/dL — ABNORMAL LOW (ref 6.5–8.1)

## 2019-02-06 LAB — CBC
HCT: 32.7 % — ABNORMAL LOW (ref 39.0–52.0)
Hemoglobin: 11.3 g/dL — ABNORMAL LOW (ref 13.0–17.0)
MCH: 33.8 pg (ref 26.0–34.0)
MCHC: 34.6 g/dL (ref 30.0–36.0)
MCV: 97.9 fL (ref 80.0–100.0)
Platelets: 94 10*3/uL — ABNORMAL LOW (ref 150–400)
RBC: 3.34 MIL/uL — ABNORMAL LOW (ref 4.22–5.81)
RDW: 12.7 % (ref 11.5–15.5)
WBC: 5.2 10*3/uL (ref 4.0–10.5)
nRBC: 1.4 % — ABNORMAL HIGH (ref 0.0–0.2)

## 2019-02-06 LAB — GLUCOSE, CAPILLARY
Glucose-Capillary: 214 mg/dL — ABNORMAL HIGH (ref 70–99)
Glucose-Capillary: 371 mg/dL — ABNORMAL HIGH (ref 70–99)
Glucose-Capillary: 211 mg/dL — ABNORMAL HIGH (ref 70–99)
Glucose-Capillary: 107 mg/dL — ABNORMAL HIGH (ref 70–99)

## 2019-02-06 LAB — ETHANOL: Alcohol, Ethyl (B): <10 mg/dL (ref <10)

## 2019-02-06 LAB — SALICYLATE LEVEL: Salicylate Lvl: <7 mg/dL (ref 2.8–30.0)

## 2019-02-06 LAB — ACETAMINOPHEN LEVEL: Acetaminophen (Tylenol), Serum: <10 ug/mL — ABNORMAL LOW (ref 10–30)

## 2019-02-06 LAB — SARS CORONAVIRUS 2 (TAT 6-24 HRS): SARS Coronavirus 2: NEGATIVE

## 2019-02-06 MED ORDER — LIVING WELL WITH DIABETES BOOK
Freq: Once | Status: AC
  Administered 2019-02-06: 12:00:00
  Filled 2019-02-06: qty 1

## 2019-02-06 MED ORDER — ENOXAPARIN SODIUM 40 MG/0.4ML ~~LOC~~ SOLN
40.0000 mg | SUBCUTANEOUS | Status: DC
  Administered 2019-02-06: 40 mg via SUBCUTANEOUS
  Filled 2019-02-06: qty 0.4

## 2019-02-06 MED ORDER — INSULIN DETEMIR 100 UNIT/ML ~~LOC~~ SOLN
25.0000 [IU] | Freq: Every day | SUBCUTANEOUS | Status: DC
  Administered 2019-02-07: 25 [IU] via SUBCUTANEOUS
  Filled 2019-02-06 (×2): qty 0.25

## 2019-02-06 MED ORDER — INSULIN ASPART 100 UNIT/ML ~~LOC~~ SOLN
3.0000 [IU] | Freq: Three times a day (TID) | SUBCUTANEOUS | Status: DC
  Administered 2019-02-06 – 2019-02-07 (×3): 3 [IU] via SUBCUTANEOUS
  Filled 2019-02-06 (×3): qty 1

## 2019-02-06 MED ORDER — INSULIN DETEMIR 100 UNIT/ML ~~LOC~~ SOLN
20.0000 [IU] | Freq: Every day | SUBCUTANEOUS | Status: DC
  Administered 2019-02-06: 20 [IU] via SUBCUTANEOUS
  Filled 2019-02-06 (×2): qty 0.2

## 2019-02-06 MED ORDER — VITAMIN K1 10 MG/ML IJ SOLN
10.0000 mg | Freq: Every day | INTRAMUSCULAR | Status: DC
  Administered 2019-02-06 – 2019-02-07 (×2): 10 mg via SUBCUTANEOUS
  Filled 2019-02-06 (×2): qty 1

## 2019-02-06 MED ORDER — KETOROLAC TROMETHAMINE 15 MG/ML IJ SOLN
15.0000 mg | Freq: Once | INTRAMUSCULAR | Status: AC
  Administered 2019-02-06: 15 mg via INTRAVENOUS
  Filled 2019-02-06: qty 1

## 2019-02-06 NOTE — Progress Notes (Signed)
Inpatient Diabetes Program Recommendations  AACE/ADA: New Consensus Statement on Inpatient Glycemic Control (2015)  Target Ranges:  Prepandial:   less than 140 mg/dL      Peak postprandial:   less than 180 mg/dL (1-2 hours)      Critically ill patients:  140 - 180 mg/dL   Lab Results  Component Value Date   GLUCAP 214 (H) 02/06/2019   HGBA1C 10.2 (H) 02/05/2019    Review of Glycemic Control Results for Dennis Rowland, Dennis Rowland (MRN [id number]) as of 02/06/2019 10:36  Ref. Range 02/05/2019 12:05 02/05/2019 15:12 02/05/2019 17:09 02/05/2019 21:21 02/06/2019 07:38  Glucose-Capillary Latest Ref Range: 70 - 99 mg/dL 495 (H) 332 (H) 299 (H) 284 (H) 214 (H)   Diabetes history: DM2 Outpatient Diabetes medications: Levemir 40 units qd + Metformin 500 mg bid Current orders for Inpatient glycemic control: Levemir 20 units + Novolog moderate tid + hs 0-5 units  Inpatient Diabetes Program Recommendations:   Spoke with pt about A1C results with them 10.2 (average blood glucose 246 over the past 2-3 months) and explained what an A1C is, basic pathophysiology of DM Type 2, basic home care, basic diabetes diet nutrition principles, importance of checking CBGs and maintaining good CBG control to prevent long-term and short-term complications. Reviewed signs and symptoms of hyperglycemia and hypoglycemia and how to treat hypoglycemia at home. Also reviewed blood sugar goals at home.  RNs to provide ongoing basic DM education at bedside with this patient. Have ordered educational booklet. Patient states he has been drinking 1 gallon milk over 3 days, sugar in coffee and other sugary drinks. Reviewed with him  Basic nutrition principles regarding limiting sugary drinks and carbohydrates. Patient states he has not taken his Levemir since Monday night so requested for Levemir to be given this am to start daily dose and received verbal order from Dr. Verdell Carmine. Reviewed change in Levemir schedule with RN Osvaldo Human.  Thank you, Nani Gasser. Arushi Partridge, RN, MSN, CDE  Diabetes Coordinator Inpatient Glycemic Control Team Team Pager [phone] (8am-5pm) 02/06/2019 11:18 AM

## 2019-02-06 NOTE — Consult Note (Signed)
[name], MD [address]  [place]  [place], [place] [address]  Main: [phone]  Fax: [phone] Pager: [phone]   Consultation  Referring Provider:     No ref. provider found Primary Care Physician:  Center, [facility] Primary Gastroenterologist: Althia Forts       Reason for Consultation:     Elevated LFTs  Date of Admission:  02/05/2019 Date of Consultation:  02/06/2019         HPI:   Dennis Thielke. is a 58 y.o. male with history of alcohol use, polysubstance abuse including cocaine, diabetes, hypertension, hyperlipidemia who was sent from [facility] clinic yesterday due to elevated transaminases AST 4124, ALT 2874.  Patient did not have any symptoms on arrival.  Repeat labs showed AST 2910, ALT 3930, alkaline phosphatase 70, total bilirubin 2.2.  His transaminases improved this morning to AST 824, ALT 2212, AP 59, T bili increased to 2.5.  His last normal LFTs were in 05/2018.  CBC revealed hemoglobin 9.3, platelets 94, PT/INR 22.4/2.1 Patient reports mild discomfort in bilateral inguinal area, thinks his hernias are hurting.  When I walked into the room, he just came out of shower and appeared comfortably sitting in his bed.  He was more focused on ordering lunch and answering my questions.  He reported his last syncopal alcohol was 2 days ago.  He said he smoked marijuana and he does not know if he smoked cocaine.  He was cocaine positive in 05/2018.  He denies chest pain, nausea vomiting, upper abdominal pain. Limited right upper quadrant ultrasound revealed normal gallbladder, normal liver and patent portal vein  NSAIDs: None  Antiplts/Anticoagulants/Anti thrombotics: None  GI Procedures: Unknown  Past Medical History:  Diagnosis Date  . Arthritis   . Chronic sniffling   . Diabetes ([place])   . Dyspnea    some doe  . GERD (gastroesophageal reflux disease)   . Gout   . Hyperlipidemia   . Hypertension   . Neuropathy     Past Surgical  History:  Procedure Laterality Date  . NO PAST SURGERIES      Prior to Admission medications   Medication Sig Start Date End Date Taking? Authorizing Provider  Insulin Detemir (LEVEMIR FLEXTOUCH) 100 UNIT/ML Pen Inject 40 Units into the skin at bedtime.    Yes [provider]  metFORMIN (GLUCOPHAGE) 500 MG tablet Take 500 mg by mouth 2 (two) times daily with a meal.    Yes [provider]  aspirin EC 81 MG tablet Take 81 mg by mouth daily.     [provider]  cetirizine (ZYRTEC) 10 MG tablet Take 10 mg by mouth daily.     [provider]  chlorhexidine (PERIDEX) 0.12 % solution Use as directed 15 mLs in the mouth or throat daily.  03/13/18   [provider]  colchicine 0.6 MG tablet Take 0.6 mg by mouth daily.     [provider]  gabapentin (NEURONTIN) 300 MG capsule Take 300 mg by mouth every 4 (four) hours as needed (for pain).     [provider]  HYDROcodone-acetaminophen (NORCO/VICODIN) 5-325 MG tablet TAKE 1 TABLET BY MOUTH EVERY 4 TO 6 HOURS AS NEEDED FOR PAIN 06/12/18   [provider]  indomethacin (INDOCIN) 50 MG capsule Take 50 mg by mouth daily.     [provider]  lisinopril (PRINIVIL,ZESTRIL) 5 MG tablet Take 5 mg by mouth daily.  03/02/15   [provider]  meloxicam (MOBIC) 15 MG tablet Take 1 tablet (15 mg total) by mouth daily. 03/25/18   Gardiner Barefoot, DPM  mupirocin cream Drue Stager) 2 % Apply to affected area 3 times daily 09/27/18 09/27/19  Arta Silence, MD  pantoprazole (PROTONIX) 40 MG tablet Take 40 mg by mouth daily.     [provider]  sertraline (ZOLOFT) 100 MG tablet Take 100 mg by mouth daily.    [provider]  simvastatin (ZOCOR) 20 MG tablet Take 20 mg by mouth daily.     [provider]   Current Facility-Administered Medications:  .  0.9 %  sodium chloride infusion, , Intravenous, Continuous, Vaughan Basta, MD, Last Rate: 75  mL/hr at 02/06/19 0738 .  aspirin EC tablet 81 mg, 81 mg, Oral, Daily, Vaughan Basta, MD, 81 mg at 02/06/19 0850 .  colchicine tablet 0.6 mg, 0.6 mg, Oral, Daily, Vaughan Basta, MD, 0.6 mg at 02/06/19 0849 .  docusate sodium (COLACE) capsule 100 mg, 100 mg, Oral, BID PRN, Vaughan Basta, MD .  gabapentin (NEURONTIN) capsule 300 mg, 300 mg, Oral, Q4H PRN, Vaughan Basta, MD .  heparin injection 5,000 Units, 5,000 Units, Subcutaneous, Q8H, Vaughan Basta, MD, 5,000 Units at 02/06/19 0454 .  insulin aspart (novoLOG) injection 0-15 Units, 0-15 Units, Subcutaneous, TID WC, Vanessa [place], MD, 15 Units at 02/06/19 1157 .  insulin aspart (novoLOG) injection 0-5 Units, 0-5 Units, Subcutaneous, QHS, Vaughan Basta, MD, 3 Units at 02/05/19 2207 .  insulin detemir (LEVEMIR) injection 20 Units, 20 Units, Subcutaneous, Daily, Sainani, Vivek J, MD .  lisinopril (ZESTRIL) tablet 5 mg, 5 mg, Oral, Daily, Vaughan Basta, MD .  loratadine (CLARITIN) tablet 10 mg, 10 mg, Oral, Daily, Vaughan Basta, MD, 10 mg at 02/06/19 0849 .  pantoprazole (PROTONIX) EC tablet 40 mg, 40 mg, Oral, Daily, Vaughan Basta, MD, 40 mg at 02/06/19 0850 .  phytonadione (VITAMIN K) SQ injection 10 mg, 10 mg, Subcutaneous, Daily, [place], Tally Due, MD, 10 mg at 02/06/19 1157 .  sertraline (ZOLOFT) tablet 100 mg, 100 mg, Oral, Daily, Vaughan Basta, MD   Family History  Problem Relation Age of Onset  . Hypertension Mother      Social History   Tobacco Use  . Smoking status: Current Every Day Smoker  . Smokeless tobacco: Never Used  Substance Use Topics  . Alcohol use: Yes    Alcohol/week: 6.0 standard drinks    Types: 6 Cans of beer per week    Comment: DAILY  . Drug use: Yes    Types: Cocaine, Marijuana    Allergies as of 02/05/2019 - Review Complete 02/05/2019  Allergen Reaction Noted  . Tape Anaphylaxis 08/29/2012  . Dye fdc red [red dye]  Nausea And Vomiting 10/01/2013  . Hydrochlorothiazide w-triamterene Other (See Comments) 02/23/2010    Review of Systems:    All systems reviewed and negative except where noted in HPI.   Physical Exam:  Vital signs in last 24 hours: Temp:  [98.2 F (36.8 C)-99.5 F (37.5 C)] 98.2 F (36.8 C) (09/24 0736) Pulse Rate:  [71-87] 71 (09/24 0736) Resp:  [18-20] 20 (09/23 2124) BP: (113-164)/(76-87) 113/80 (09/24 0736) SpO2:  [98 %-100 %] 99 % (09/24 0736) Last BM Date: 02/06/19 General:   Pleasant, cooperative in NAD Head:  Normocephalic and atraumatic. Eyes:   No icterus.   Conjunctiva pink. PERRLA. Ears:  Normal auditory acuity. Neck:  Supple; no masses or thyroidomegaly Lungs: Respirations even and unlabored. Lungs clear to auscultation bilaterally.  No wheezes, crackles, or rhonchi.  Heart:  Regular rate and rhythm;  Without murmur, clicks, rubs or gallops Abdomen:  Soft, nondistended, nontender. Normal bowel sounds. No appreciable masses or hepatomegaly.  No rebound or guarding.  Rectal:  Not performed. Msk:  Symmetrical without gross deformities.  Strength normal Extremities:  Without edema, cyanosis or clubbing. Neurologic:  Alert and oriented x3;  grossly normal neurologically. Skin:  Intact without significant lesions or rashes. Psych:  Alert and cooperative. Normal affect.  LAB RESULTS: CBC Latest Ref Rng & Units 02/06/2019 02/05/2019 09/27/2018  WBC 4.0 - 10.5 K/uL 5.2 5.7 4.1  Hemoglobin 13.0 - 17.0 g/dL 11.3(L) 12.6(L) 16.9  Hematocrit 39.0 - 52.0 % 32.7(L) 36.7(L) 49.4  Platelets 150 - 400 K/uL 94(L) 117(L) 175    BMET BMP Latest Ref Rng & Units 02/06/2019 02/05/2019 09/27/2018  Glucose 70 - 99 mg/dL 273(H) 551(HH) 136(H)  BUN 6 - 20 mg/dL <5(L) 8 9  Creatinine 0.61 - 1.24 mg/dL 0.49(L) 0.82 0.80  Sodium 135 - 145 mmol/L 137 132(L) 136  Potassium 3.5 - 5.1 mmol/L 3.8 4.1 4.1  Chloride 98 - 111 mmol/L 105 95(L) 103  CO2 22 - 32 mmol/L _0 Calcium 8.9 - 10.3  mg/dL 7.5(L) 8.5(L) 8.8(L)    LFT Hepatic Function Latest Ref Rng & Units 02/06/2019 02/05/2019 05/20/2018  Total Protein 6.5 - 8.1 g/dL 4.9(L) 6.3(L) 7.6  Albumin 3.5 - 5.0 g/dL 2.6(L) 3.4(L) 4.3  AST 15 - 41 U/L 824(H) 2,910(H) 18  ALT 0 - 44 U/L 2,212(H) 3,930(H) 25  Alk Phosphatase 38 - 126 U/L 59 70 70  Total Bilirubin 0.3 - 1.2 mg/dL 2.5(H) 2.2(H) 0.6  Bilirubin, Direct 0.0 - 0.2 mg/dL - 1.0(H) <0.1     STUDIES: US Abdomen Limited Ruq  Result Date: 02/05/2019 CLINICAL DATA:  Right upper quadrant pain for 2 days. Elevated LFTs and lipase. EXAM: ULTRASOUND ABDOMEN LIMITED RIGHT UPPER QUADRANT COMPARISON:  None. FINDINGS: Gallbladder: There is diffuse gallbladder wall thickening measuring up to 7 mm. No gallstones or sludge. No sonographic Murphy sign noted by sonographer. Common bile duct: Diameter: 4 mm Liver: No focal lesion identified. Within normal limits in parenchymal echogenicity. Portal vein is patent on color Doppler imaging with normal direction of blood flow towards the liver. Other: None. IMPRESSION: Diffuse gallbladder wall thickening which alone is a nonspecific finding and can occur in the setting of gallbladder inflammation, or secondary to hepatitis, volume overload or hypoalbuminemia among others. Electronically Signed   By: Audie Pinto M.D.   On: 02/05/2019 16:35      Impression / Plan:   Dennis Rowland. is a 58 y.o. male with with history of polysubstance abuse, hypertension, diabetes, hyperlipidemia seen in consultation for acute hepatitis with coagulopathy as well as new onset of thrombocytopenia.  Patient does not have signs or symptoms to suggest chronic liver disease  Differentials include ischemic hepatitis secondary to cocaine use or acute viral hepatitis Serum Tylenol levels undetectable  Acute hepatitis: With acute liver failure There is no evidence of portal vein thrombosis on ultrasound Transaminases are improving Viral hepatitis panel,  HIV Check HSV, CMV, VZV serologies Check salicylate levels Urine toxicology screen Watch for symptoms of encephalopathy, he does not have any at present Administer vitamin K 10 mg subcut daily for 3 days, monitor PT/INR daily Monitor LFTs, CBC daily Tight control of diabetes  Thank you for involving me in the care of this patient.  GI will follow along with you  LOS: 1 day   [name], MD  02/06/2019, 12:59 PM   Note: This dictation was prepared with Dragon dictation along with smaller phrase technology. Any transcriptional errors that result from this process are unintentional.

## 2019-02-06 NOTE — Progress Notes (Signed)
Pt c/o groin pain. MD [facility] notified. Order received for Toradol IV 15 mg. Order placed.

## 2019-02-06 NOTE — Progress Notes (Signed)
Dennis Rowland at [place] NAME: Dennis Rowland    MR#:  [id number]  DATE OF BIRTH:  [birth year]  SUBJECTIVE:   Patient presented to the hospital secondary to abnormal labs and noted to have a significant transaminitis.  Patient presently denies any abdominal pain nausea or vomiting.   He denies any recent alcohol binging episode.  He denies any recreational drug abuse.  REVIEW OF SYSTEMS:    Review of Systems  Constitutional: Negative for chills and fever.  HENT: Negative for congestion and tinnitus.   Eyes: Negative for blurred vision and double vision.  Respiratory: Negative for cough, shortness of breath and wheezing.   Cardiovascular: Negative for chest pain, orthopnea and PND.  Gastrointestinal: Negative for abdominal pain, diarrhea, nausea and vomiting.  Genitourinary: Negative for dysuria and hematuria.  Neurological: Negative for dizziness, sensory change and focal weakness.  All other systems reviewed and are negative.   Nutrition: Carb control Tolerating Diet: Yes Tolerating PT: Ambulatory   DRUG ALLERGIES:   Allergies  Allergen Reactions  . Tape Anaphylaxis  . Dye Fdc Red [Red Dye] Nausea And Vomiting  . Hydrochlorothiazide W-Triamterene Other (See Comments)    Unknown    VITALS:  Blood pressure 113/80, pulse 71, temperature 98.2 F (36.8 C), temperature source Oral, resp. rate 20, height 5\' 4"  (1.626 m), weight 64.9 kg, SpO2 99 %.  PHYSICAL EXAMINATION:   Physical Exam  GENERAL:  58 y.o.-year-old patient lying in bed in no acute distress.  EYES: Pupils equal, round, reactive to light and accommodation. No scleral icterus. Extraocular muscles intact.  HEENT: Head atraumatic, normocephalic. Oropharynx and nasopharynx clear.  NECK:  Supple, no jugular venous distention. No thyroid enlargement, no tenderness.  LUNGS: Normal breath sounds bilaterally, no wheezing, rales, rhonchi. No use of accessory muscles of  respiration.  CARDIOVASCULAR: S1, S2 normal. No murmurs, rubs, or gallops.  ABDOMEN: Soft, nontender, nondistended. Bowel sounds present. No organomegaly or mass.  EXTREMITIES: No cyanosis, clubbing or edema b/l.    NEUROLOGIC: Cranial nerves II through XII are intact. No focal Motor or sensory deficits b/l.   PSYCHIATRIC: The patient is alert and oriented x 3.  SKIN: No obvious rash, lesion, or ulcer.    LABORATORY PANEL:   CBC Recent Labs  Lab 02/06/19 0443  WBC 5.2  HGB 11.3*  HCT 32.7*  PLT 94*   ------------------------------------------------------------------------------------------------------------------  Chemistries  Recent Labs  Lab 02/06/19 0443  NA 137  K 3.8  CL 105  CO2 25  GLUCOSE 273*  BUN <5*  CREATININE 0.49*  CALCIUM 7.5*  AST 824*  ALT 2,212*  ALKPHOS 59  BILITOT 2.5*   ------------------------------------------------------------------------------------------------------------------  Cardiac Enzymes No results for input(s): TROPONINI in the last 168 hours. ------------------------------------------------------------------------------------------------------------------  RADIOLOGY:  US Abdomen Limited Ruq  Result Date: 02/05/2019 CLINICAL DATA:  Right upper quadrant pain for 2 days. Elevated LFTs and lipase. EXAM: ULTRASOUND ABDOMEN LIMITED RIGHT UPPER QUADRANT COMPARISON:  None. FINDINGS: Gallbladder: There is diffuse gallbladder wall thickening measuring up to 7 mm. No gallstones or sludge. No sonographic Murphy sign noted by sonographer. Common bile duct: Diameter: 4 mm Liver: No focal lesion identified. Within normal limits in parenchymal echogenicity. Portal vein is patent on color Doppler imaging with normal direction of blood flow towards the liver. Other: None. IMPRESSION: Diffuse gallbladder wall thickening which alone is a nonspecific finding and can occur in the setting of gallbladder inflammation, or secondary to hepatitis, volume  overload or hypoalbuminemia among  others. Electronically Signed   By: Audie Pinto M.D.   On: 02/05/2019 16:35     ASSESSMENT AND PLAN:   58 year old male with past medical history of hypertension, hyperlipidemia, history of gout, GERD, diabetes, osteoarthritis, neuropathy who presented to the hospital due to abnormal LFTs.  1.  Abnormal LFTs- etiology unclear but suspected to be secondary to acute hepatitis questionable viral/ischemic/autoimmune. -Patient's abdominal ultrasound shows no evidence of liver cirrhosis. - Appreciate gastroenterology input and await abdominal ultrasound with Doppler to rule out portal vein thrombosis. - Await hepatitis serologies, follow LFTs.  Patient's Tylenol level is normal.  Acutely patient is not symptomatic.  2.  Coagulopathy- suspected to be secondary to liver disease.  Patient has no acute bleeding. - As per GI we will give vitamin K, follow INR.  3.  Thrombocytopenia-secondary to underlying suspected liver disease. -No evidence of acute bleeding presently.  4.  Diabetes type 2 without complication-continue Levemir, NovoLog with meals and sliding scale insulin.  5.  Essential hypertension-continue lisinopril.  6.  Depression-continue Zoloft.  7. Hx of Gout - cont. Colchicine.   - no acute attack.   8. Hx of Diabetic Neuropathy - cont. Neurontin  Discussed plan of care with GI.    All the records are reviewed and case discussed with Care Management/Social Worker. Management plans discussed with the patient, family and they are in agreement.  CODE STATUS: Full code  DVT Prophylaxis: Ted's & SCD's.   TOTAL TIME TAKING CARE OF THIS PATIENT: 30 minutes.   POSSIBLE D/C IN 1-2 DAYS, DEPENDING ON CLINICAL CONDITION.   Henreitta Leber M.D on 02/06/2019 at 2:00 PM  Between 7am to 6pm - Pager - [phone]  After 6pm go to [email or web address] - [employer] [facility]  Office  [phone]  CC: Primary  care physician; Center, [facility]

## 2019-02-07 ENCOUNTER — Inpatient Hospital Stay: Payer: 59

## 2019-02-07 ENCOUNTER — Encounter: Payer: Self-pay | Admitting: Radiology

## 2019-02-07 LAB — IRON AND TIBC
Iron: 179 ug/dL (ref 45–182)
Saturation Ratios: 87 % — ABNORMAL HIGH (ref 17.9–39.5)
TIBC: 205 ug/dL — ABNORMAL LOW (ref 250–450)
UIBC: 26 ug/dL

## 2019-02-07 LAB — PROTIME-INR
INR: 1.5 — ABNORMAL HIGH (ref 0.8–1.2)
Prothrombin Time: 18.1 seconds — ABNORMAL HIGH (ref 11.4–15.2)

## 2019-02-07 LAB — CBC
HCT: 29.8 % — ABNORMAL LOW (ref 39.0–52.0)
Hemoglobin: 10.4 g/dL — ABNORMAL LOW (ref 13.0–17.0)
MCH: 34.6 pg — ABNORMAL HIGH (ref 26.0–34.0)
MCHC: 34.9 g/dL (ref 30.0–36.0)
MCV: 99 fL (ref 80.0–100.0)
Platelets: 87 10*3/uL — ABNORMAL LOW (ref 150–400)
RBC: 3.01 MIL/uL — ABNORMAL LOW (ref 4.22–5.81)
RDW: 12.5 % (ref 11.5–15.5)
WBC: 5.6 10*3/uL (ref 4.0–10.5)
nRBC: 0 % (ref 0.0–0.2)

## 2019-02-07 LAB — COMPREHENSIVE METABOLIC PANEL
ALT: 1381 U/L — ABNORMAL HIGH (ref 0–44)
AST: 264 U/L — ABNORMAL HIGH (ref 15–41)
Albumin: 2.3 g/dL — ABNORMAL LOW (ref 3.5–5.0)
Alkaline Phosphatase: 58 U/L (ref 38–126)
Anion gap: 6 (ref 5–15)
BUN: 6 mg/dL (ref 6–20)
CO2: 22 mmol/L (ref 22–32)
Calcium: 7.3 mg/dL — ABNORMAL LOW (ref 8.9–10.3)
Chloride: 110 mmol/L (ref 98–111)
Creatinine, Ser: 0.55 mg/dL — ABNORMAL LOW (ref 0.61–1.24)
GFR calc Af Amer: >60 mL/min (ref >60)
GFR calc non Af Amer: >60 mL/min (ref >60)
Glucose, Bld: 194 mg/dL — ABNORMAL HIGH (ref 70–99)
Potassium: 3.6 mmol/L (ref 3.5–5.1)
Sodium: 138 mmol/L (ref 135–145)
Total Bilirubin: 1.5 mg/dL — ABNORMAL HIGH (ref 0.3–1.2)
Total Protein: 4.5 g/dL — ABNORMAL LOW (ref 6.5–8.1)

## 2019-02-07 LAB — GLUCOSE, CAPILLARY
Glucose-Capillary: 200 mg/dL — ABNORMAL HIGH (ref 70–99)
Glucose-Capillary: 240 mg/dL — ABNORMAL HIGH (ref 70–99)
Glucose-Capillary: 186 mg/dL — ABNORMAL HIGH (ref 70–99)

## 2019-02-07 LAB — HSV 1 AND 2 IGM ABS, INDIRECT
HSV 1 IgM Antibodies: <1:10 {titer}
HSV 2 IgM Antibodies: <1:10 {titer}

## 2019-02-07 LAB — CMV IGM: CMV IgM: <30 AU/mL (ref 0.0–29.9)

## 2019-02-07 LAB — HEPATITIS PANEL, ACUTE
HCV Ab: >11 s/co ratio — ABNORMAL HIGH (ref 0.0–0.9)
Hep A IgM: NEGATIVE
Hep B C IgM: NEGATIVE
Hepatitis B Surface Ag: NEGATIVE

## 2019-02-07 LAB — HEPATITIS B E ANTIGEN: Hep B E Ag: NEGATIVE

## 2019-02-07 LAB — FERRITIN: Ferritin: 1653 ng/mL — ABNORMAL HIGH (ref 24–336)

## 2019-02-07 LAB — HIV ANTIBODY (ROUTINE TESTING W REFLEX): HIV Screen 4th Generation wRfx: NONREACTIVE

## 2019-02-07 MED ORDER — PANTOPRAZOLE SODIUM 40 MG PO TBEC: 40.0000 mg | DELAYED_RELEASE_TABLET | Freq: Every day | ORAL | Status: DC

## 2019-02-07 MED ORDER — CETIRIZINE HCL 10 MG PO TABS: 10.0000 mg | ORAL_TABLET | Freq: Every day | ORAL | Status: AC

## 2019-02-07 MED ORDER — ASPIRIN EC 81 MG PO TBEC: 81.0000 mg | DELAYED_RELEASE_TABLET | Freq: Every day | ORAL | Status: AC

## 2019-02-07 MED ORDER — IOHEXOL 9 MG/ML PO SOLN
500.0000 mL | ORAL | Status: AC
  Administered 2019-02-07 (×2): 500 mL via ORAL

## 2019-02-07 MED ORDER — PHYTONADIONE 5 MG PO TABS: 5.0000 mg | ORAL_TABLET | Freq: Every day | ORAL | Status: DC

## 2019-02-07 MED ORDER — IOHEXOL 300 MG/ML  SOLN
100.0000 mL | Freq: Once | INTRAMUSCULAR | Status: AC | PRN
  Administered 2019-02-07: 100 mL via INTRAVENOUS

## 2019-02-07 NOTE — Care Management Important Message (Signed)
Important Message  Patient Details  Name: Dennis Rowland. MRN: [id number] Date of Birth: [birth year]   Medicare Important Message Given:  Yes     Dannette Barbara 02/07/2019, 10:22 AM

## 2019-02-07 NOTE — Discharge Summary (Signed)
[place] [facility] [place] NAME: Dennis Rowland    MR#:  [id number]  DATE OF BIRTH:  12/30/1960  DATE OF ADMISSION:  02/05/2019 ADMITTING PHYSICIAN: Vaughan Basta, MD  DATE OF DISCHARGE: No discharge date for patient encounter.  PRIMARY CARE PHYSICIAN: Center, [facility]    ADMISSION DIAGNOSIS:  RUQ pain [R10.11] Hyperglycemia [R73.9] Elevated LFTs [R94.5]  DISCHARGE DIAGNOSIS:  Principal Problem:   Elevated LFTs   SECONDARY DIAGNOSIS:   Past Medical History:  Diagnosis Date  . Arthritis   . Chronic sniffling   . Diabetes ([place])   . Dyspnea    some doe  . GERD (gastroesophageal reflux disease)   . Gout   . Hyperlipidemia   . Hypertension   . Neuropathy     HOSPITAL COURSE:   58 year old male with past medical history of hypertension, hyperlipidemia, history of gout, GERD, diabetes, osteoarthritis, neuropathy who presented to the hospital due to abnormal LFTs.  1.  Abnormal LFTs- etiology unclear but suspected to be secondary to acute hepatitis/possible underlying cirrhosis given his alcohol abuse. - LFTs have trended down.  Patient's abdominal ultrasound showed no evidence of liver cirrhosis. -CT abdomen pelvis is suggestive of ascites and hepatic steatosis.   - LFT's have trended down and further serologic testing ordered by GI and can be followed up as outpatient. Pt. Is asymptomatic.  - Patient's hepatitis C antibody was positive but other serologies are negative.  2.  Coagulopathy- suspected to be secondary to liver disease.  Patient has no acute bleeding. - Given vitamin K yesterday and INR is down to 1.5.   3.  Thrombocytopenia-secondary to underlying suspected liver disease. -No evidence of acute bleeding presently.  4.  Diabetes type 2 without complication-continue Levemir, NovoLog with meals and sliding scale insulin. - BS stable and appreciate DM Coordinator input.   5.  Essential  hypertension-continue lisinopril.  6.  Depression-continue Zoloft.  7. Hx of Gout - cont. Colchicine.   - no acute attack.   8. Hx of Diabetic Neuropathy - cont. Neurontin  DISCHARGE CONDITIONS:   Stable  CONSULTS OBTAINED:  Treatment Team:  Lin Landsman, MD  DRUG ALLERGIES:   Allergies  Allergen Reactions  . Tape Anaphylaxis  . Dye Fdc Red [Red Dye] Nausea And Vomiting  . Hydrochlorothiazide W-Triamterene Other (See Comments)    Unknown    DISCHARGE MEDICATIONS:   Allergies as of 02/07/2019      Reactions   Tape Anaphylaxis   Dye Fdc Red [red Dye] Nausea And Vomiting   Hydrochlorothiazide W-triamterene Other (See Comments)   Unknown      Medication List    TAKE these medications   allopurinol 100 MG tablet Commonly known as: ZYLOPRIM Take 100 mg by mouth daily.   aspirin EC 81 MG tablet Take 1 tablet (81 mg total) by mouth daily.   cetirizine 10 MG tablet Commonly known as: ZYRTEC Take 1 tablet (10 mg total) by mouth daily.   colchicine 0.6 MG tablet Take 0.6 mg by mouth daily.   gabapentin 300 MG capsule Commonly known as: NEURONTIN Take 300 mg by mouth 3 (three) times daily.   Levemir FlexTouch 100 UNIT/ML Pen Generic drug: Insulin Detemir Inject 40 Units into the skin at bedtime.   lisinopril 5 MG tablet Commonly known as: ZESTRIL Take 5 mg by mouth daily.   metFORMIN 850 MG tablet Commonly known as: GLUCOPHAGE Take 850 mg by mouth 2 (two) times daily with a meal.  mupirocin cream 2 % Commonly known as: Bactroban Apply to affected area 3 times daily   pantoprazole 40 MG tablet Commonly known as: PROTONIX Take 1 tablet (40 mg total) by mouth daily.   prazosin 2 MG capsule Commonly known as: MINIPRESS Take 2 mg by mouth at bedtime.   sertraline 100 MG tablet Commonly known as: ZOLOFT Take 100 mg by mouth daily.   sildenafil 100 MG tablet Commonly known as: VIAGRA Take 100 mg by mouth daily as needed for erectile  dysfunction.         DISCHARGE INSTRUCTIONS:   DIET:  Cardiac diet and Diabetic diet  DISCHARGE CONDITION:  Stable  ACTIVITY:  Activity as tolerated  OXYGEN:  Home Oxygen: No.   Oxygen Delivery: room air  DISCHARGE LOCATION:  home   If you experience worsening of your admission symptoms, develop shortness of breath, life threatening emergency, suicidal or homicidal thoughts you must seek medical attention immediately by calling 911 or calling your MD immediately  if symptoms less severe.  You Must read complete instructions/literature along with all the possible adverse reactions/side effects for all the Medicines you take and that have been prescribed to you. Take any new Medicines after you have completely understood and accpet all the possible adverse reactions/side effects.   Please note  You were cared for by a hospitalist during your hospital stay. If you have any questions about your discharge medications or the care you received while you were in the hospital after you are discharged, you can call the unit and asked to speak with the hospitalist on call if the hospitalist that took care of you is not available. Once you are discharged, your primary care physician will handle any further medical issues. Please note that NO REFILLS for any discharge medications will be authorized once you are discharged, as it is imperative that you return to your primary care physician (or establish a relationship with a primary care physician if you do not have one) for your aftercare needs so that they can reassess your need for medications and monitor your lab values.   CBC Recent Labs  Lab 02/07/19 0440  WBC 5.6  HGB 10.4*  HCT 29.8*  PLT 87*    Chemistries  Recent Labs  Lab 02/07/19 0440  NA 138  K 3.6  CL 110  CO2 22  GLUCOSE 194*  BUN 6  CREATININE 0.55*  CALCIUM 7.3*  AST 264*  ALT 1,381*  ALKPHOS 58  BILITOT 1.5*    Cardiac Enzymes No results for  input(s): TROPONINI in the last 168 hours.  Microbiology Results  Results for orders placed or performed during the hospital encounter of 02/05/19  SARS CORONAVIRUS 2 (TAT 6-24 HRS) Nasopharyngeal Nasopharyngeal Swab     Status: None   Collection Time: 02/05/19  5:38 PM   Specimen: Nasopharyngeal Swab  Result Value Ref Range Status   SARS Coronavirus 2 NEGATIVE NEGATIVE Final    Comment: (NOTE) SARS-CoV-2 target nucleic acids are NOT DETECTED. The SARS-CoV-2 RNA is generally detectable in upper and lower respiratory specimens during the acute phase of infection. Negative results do not preclude SARS-CoV-2 infection, do not rule out co-infections with other pathogens, and should not be used as the sole basis for treatment or other patient management decisions. Negative results must be combined with clinical observations, patient history, and epidemiological information. The expected result is Negative. Fact Sheet for Patients: SugarRoll.be Fact Sheet for Healthcare Providers: https://www.woods-mathews.com/ This test is not yet approved or  cleared by the [place] and  has been authorized for detection and/or diagnosis of SARS-CoV-2 by FDA under an Emergency Use Authorization (EUA). This EUA will remain  in effect (meaning this test can be used) for the duration of the COVID-19 declaration under Section 56 4(b)(1) of the Act, 21 U.S.C. section 360bbb-3(b)(1), unless the authorization is terminated or revoked sooner. Performed at [facility], [place] [address], [place], [place] [address]     RADIOLOGY:  Ct Abdomen Pelvis W Wo Contrast  Result Date: 02/07/2019 CLINICAL DATA:  Cirrhosis. History of polysubstance abuse and alcohol abuse. Elevated liver transaminases EXAM: CT ABDOMEN AND PELVIS WITHOUT AND WITH CONTRAST TECHNIQUE: Multidetector CT imaging of the abdomen and pelvis was performed following the standard protocol before  and following the bolus administration of intravenous contrast. CONTRAST:  160mL OMNIPAQUE IOHEXOL 300 MG/ML  SOLN COMPARISON:  Abdominal sonogram 02/05/2019. FINDINGS: Lower chest: Small bilateral pleural effusions, right greater than left. Hepatobiliary: Hepatic steatosis identified. Normal contour of the liver. No focal enhancing liver abnormality identified. No gallstones identified. Diffuse gallbladder wall edema is noted. No biliary ductal dilatation. Pancreas: Unremarkable. No pancreatic ductal dilatation or surrounding inflammatory changes. Spleen: Normal in size without focal abnormality. Adrenals/Urinary Tract: Normal appearance of the adrenal glands. There are several small cortical based low-density foci within the right kidney measuring less than 1 cm and therefore are too small to reliably characterize. No hydronephrosis identified bilaterally. Urinary bladder appears normal. Stomach/Bowel: Stomach is within normal limits. Appendix appears normal. No evidence of bowel wall thickening, distention, or inflammatory changes. Distal colonic diverticulosis identified without acute inflammation. Vascular/Lymphatic: Aortic atherosclerosis. No aneurysm. The portal vein and splenic vein are patent. The intrahepatic veins and IVC are patent. No abdominopelvic adenopathy. Reproductive: Prostate is unremarkable. Other: There is a small to moderate volume of ascites identified. Small fat containing inguinal hernias. Musculoskeletal: No acute or significant osseous findings. IMPRESSION: 1. Diffuse hepatic steatosis.  No focal liver abnormality noted. 2. Diffuse gallbladder wall edema. Nonspecific in the setting of cirrhosis and ascites. 3. Ascites 4. Small bilateral pleural effusions. 5.  Aortic Atherosclerosis (ICD10-I70.0). Electronically Signed   By: Kerby Moors M.D.   On: 02/07/2019 14:54   US Abdomen Limited  Result Date: 02/07/2019 CLINICAL DATA:  Evaluate for ascites and possible paracentesis. EXAM:  LIMITED ABDOMEN ULTRASOUND FOR ASCITES TECHNIQUE: Limited ultrasound survey for ascites was performed in all four abdominal quadrants. COMPARISON:  Ultrasound 07/07/2018 and CT abdomen 02/07/2019 FINDINGS: Trace perihepatic ascites. Trace perisplenic ascites. No significant fluid in the left lower quadrant. IMPRESSION: Trace upper abdominal ascites. Paracentesis not performed due to the small ascites volume. Electronically Signed   By: Markus Daft M.D.   On: 02/07/2019 16:05   US Abdomen Limited Ruq  Result Date: 02/05/2019 CLINICAL DATA:  Right upper quadrant pain for 2 days. Elevated LFTs and lipase. EXAM: ULTRASOUND ABDOMEN LIMITED RIGHT UPPER QUADRANT COMPARISON:  None. FINDINGS: Gallbladder: There is diffuse gallbladder wall thickening measuring up to 7 mm. No gallstones or sludge. No sonographic Murphy sign noted by sonographer. Common bile duct: Diameter: 4 mm Liver: No focal lesion identified. Within normal limits in parenchymal echogenicity. Portal vein is patent on color Doppler imaging with normal direction of blood flow towards the liver. Other: None. IMPRESSION: Diffuse gallbladder wall thickening which alone is a nonspecific finding and can occur in the setting of gallbladder inflammation, or secondary to hepatitis, volume overload or hypoalbuminemia among others. Electronically Signed   By: Jac Canavan.D.  On: 02/05/2019 16:35      Management plans discussed with the patient, family and they are in agreement.  CODE STATUS:     Code Status Orders  (From admission, onward)         Start     Ordered   02/05/19 2127  Full code  Continuous     02/05/19 2127       TOTAL TIME TAKING CARE OF THIS PATIENT: 40 minutes.    Henreitta Leber M.D on 02/07/2019 at 4:19 PM  Between 7am to 6pm - Pager - [phone]  After 6pm go to [email or web address] - [facility]  Office  [phone]  CC: Primary care physician; Center, [facility]

## 2019-02-07 NOTE — Progress Notes (Signed)
[place] at [place] NAME: Dennis Rowland    MR#:  [id number]  DATE OF BIRTH:  27-Jul-1960  SUBJECTIVE:   Patient's LFTs have significantly improved, he denies any abdominal pain nausea or vomiting.  He complains of more pain near his left inguinal area secondary to his previous inguinal hernia.  REVIEW OF SYSTEMS:    Review of Systems  Constitutional: Negative for chills and fever.  HENT: Negative for congestion and tinnitus.   Eyes: Negative for blurred vision and double vision.  Respiratory: Negative for cough, shortness of breath and wheezing.   Cardiovascular: Negative for chest pain, orthopnea and PND.  Gastrointestinal: Negative for abdominal pain, diarrhea, nausea and vomiting.  Genitourinary: Negative for dysuria and hematuria.  Neurological: Negative for dizziness, sensory change and focal weakness.  All other systems reviewed and are negative.   Nutrition: Carb control Tolerating Diet: Yes Tolerating PT: Ambulatory   DRUG ALLERGIES:   Allergies  Allergen Reactions  . Tape Anaphylaxis  . Dye Fdc Red [Red Dye] Nausea And Vomiting  . Hydrochlorothiazide W-Triamterene Other (See Comments)    Unknown    VITALS:  Blood pressure 120/82, pulse 75, temperature 98.8 F (37.1 C), temperature source Oral, resp. rate 18, height 5\' 4"  (1.626 m), weight 64.9 kg, SpO2 98 %.  PHYSICAL EXAMINATION:   Physical Exam  GENERAL:  58 y.o.-year-old patient lying in bed in no acute distress.  EYES: Pupils equal, round, reactive to light and accommodation. No scleral icterus. Extraocular muscles intact.  HEENT: Head atraumatic, normocephalic. Oropharynx and nasopharynx clear.  NECK:  Supple, no jugular venous distention. No thyroid enlargement, no tenderness.  LUNGS: Normal breath sounds bilaterally, no wheezing, rales, rhonchi. No use of accessory muscles of respiration.  CARDIOVASCULAR: S1, S2 normal. No murmurs, rubs, or gallops.   ABDOMEN: Soft, nontender, nondistended. Bowel sounds present. No organomegaly or mass.  EXTREMITIES: No cyanosis, clubbing or edema b/l.  Left inguinal swelling consistent with an inguinal hernia. NEUROLOGIC: Cranial nerves II through XII are intact. No focal Motor or sensory deficits b/l.   PSYCHIATRIC: The patient is alert and oriented x 3.  SKIN: No obvious rash, lesion, or ulcer.    LABORATORY PANEL:   CBC Recent Labs  Lab 02/07/19 0440  WBC 5.6  HGB 10.4*  HCT 29.8*  PLT 87*   ------------------------------------------------------------------------------------------------------------------  Chemistries  Recent Labs  Lab 02/07/19 0440  NA 138  K 3.6  CL 110  CO2 22  GLUCOSE 194*  BUN 6  CREATININE 0.55*  CALCIUM 7.3*  AST 264*  ALT 1,381*  ALKPHOS 58  BILITOT 1.5*   ------------------------------------------------------------------------------------------------------------------  Cardiac Enzymes No results for input(s): TROPONINI in the last 168 hours. ------------------------------------------------------------------------------------------------------------------  RADIOLOGY:  Ct Abdomen Pelvis W Wo Contrast  Result Date: 02/07/2019 CLINICAL DATA:  Cirrhosis. History of polysubstance abuse and alcohol abuse. Elevated liver transaminases EXAM: CT ABDOMEN AND PELVIS WITHOUT AND WITH CONTRAST TECHNIQUE: Multidetector CT imaging of the abdomen and pelvis was performed following the standard protocol before and following the bolus administration of intravenous contrast. CONTRAST:  143mL OMNIPAQUE IOHEXOL 300 MG/ML  SOLN COMPARISON:  Abdominal sonogram 02/05/2019. FINDINGS: Lower chest: Small bilateral pleural effusions, right greater than left. Hepatobiliary: Hepatic steatosis identified. Normal contour of the liver. No focal enhancing liver abnormality identified. No gallstones identified. Diffuse gallbladder wall edema is noted. No biliary ductal dilatation.  Pancreas: Unremarkable. No pancreatic ductal dilatation or surrounding inflammatory changes. Spleen: Normal in size without focal abnormality.  Adrenals/Urinary Tract: Normal appearance of the adrenal glands. There are several small cortical based low-density foci within the right kidney measuring less than 1 cm and therefore are too small to reliably characterize. No hydronephrosis identified bilaterally. Urinary bladder appears normal. Stomach/Bowel: Stomach is within normal limits. Appendix appears normal. No evidence of bowel wall thickening, distention, or inflammatory changes. Distal colonic diverticulosis identified without acute inflammation. Vascular/Lymphatic: Aortic atherosclerosis. No aneurysm. The portal vein and splenic vein are patent. The intrahepatic veins and IVC are patent. No abdominopelvic adenopathy. Reproductive: Prostate is unremarkable. Other: There is a small to moderate volume of ascites identified. Small fat containing inguinal hernias. Musculoskeletal: No acute or significant osseous findings. IMPRESSION: 1. Diffuse hepatic steatosis.  No focal liver abnormality noted. 2. Diffuse gallbladder wall edema. Nonspecific in the setting of cirrhosis and ascites. 3. Ascites 4. Small bilateral pleural effusions. 5.  Aortic Atherosclerosis (ICD10-I70.0). Electronically Signed   By: Kerby Moors M.D.   On: 02/07/2019 14:54   US Abdomen Limited Ruq  Result Date: 02/05/2019 CLINICAL DATA:  Right upper quadrant pain for 2 days. Elevated LFTs and lipase. EXAM: ULTRASOUND ABDOMEN LIMITED RIGHT UPPER QUADRANT COMPARISON:  None. FINDINGS: Gallbladder: There is diffuse gallbladder wall thickening measuring up to 7 mm. No gallstones or sludge. No sonographic Murphy sign noted by sonographer. Common bile duct: Diameter: 4 mm Liver: No focal lesion identified. Within normal limits in parenchymal echogenicity. Portal vein is patent on color Doppler imaging with normal direction of blood flow towards the  liver. Other: None. IMPRESSION: Diffuse gallbladder wall thickening which alone is a nonspecific finding and can occur in the setting of gallbladder inflammation, or secondary to hepatitis, volume overload or hypoalbuminemia among others. Electronically Signed   By: Audie Pinto M.D.   On: 02/05/2019 16:35     ASSESSMENT AND PLAN:   57 year old male with past medical history of hypertension, hyperlipidemia, history of gout, GERD, diabetes, osteoarthritis, neuropathy who presented to the hospital due to abnormal LFTs.  1.  Abnormal LFTs- etiology unclear but suspected to be secondary to acute hepatitis/possible underlying cirrhosis given his alcohol abuse. - LFTs have trended down.  Patient's abdominal ultrasound showed no evidence of liver cirrhosis. -CT abdomen pelvis is suggestive of ascites and hepatic steatosis.  Gastroenterology has ordered a paracentesis for diagnostic purposes. - Patient's hepatitis C antibody was positive but other serologies are negative. - LFTs are trending down we will continue to monitor.  Continue supportive care.  2.  Coagulopathy- suspected to be secondary to liver disease.  Patient has no acute bleeding. - Given vitamin K yesterday and INR is down to 1.5.  No acute bleeding presently.  3.  Thrombocytopenia-secondary to underlying suspected liver disease. -No evidence of acute bleeding presently.  4.  Diabetes type 2 without complication-continue Levemir, NovoLog with meals and sliding scale insulin. - BS stable and appreciate DM Coordinator input.   5.  Essential hypertension-continue lisinopril.  6.  Depression-continue Zoloft.  7. Hx of Gout - cont. Colchicine.   - no acute attack.   8. Hx of Diabetic Neuropathy - cont. Neurontin  Discussed plan of care with GI.    All the records are reviewed and case discussed with Care Management/Social Worker. Management plans discussed with the patient, family and they are in agreement.  CODE STATUS:  Full code  DVT Prophylaxis: Ted's & SCD's.   TOTAL TIME TAKING CARE OF THIS PATIENT: 30 minutes.   POSSIBLE D/C IN 1-2 DAYS, DEPENDING ON CLINICAL CONDITION.   Harriette Ohara  Gaetano Net M.D on 02/07/2019 at 3:31 PM  Between 7am to 6pm - Pager - [phone]  After 6pm go to [email or web address] - [facility]  Office  [phone]  CC: Primary care physician; Center, [facility]

## 2019-02-07 NOTE — Progress Notes (Signed)
Offered to assist patient with a bath.  Patient asked for towels and wash clothes and said he would bathe himself later this morning.  Provided him with linen and asked him to let us know when he is up to bathe so we can change his linen.

## 2019-02-07 NOTE — Progress Notes (Signed)
Dennis Darby, MD [address]  [place]  [place], [place] [address]  Main: [phone]  Fax: [phone] Pager: [phone]   Subjective: Patient does not have any concerns today.  He feels fine.  No acute events overnight.  He underwent CT abdomen and pelvis which revealed mild to moderate ascites only.  No evidence of splenomegaly  Objective: Vital signs in last 24 hours: Vitals:   02/06/19 1558 02/06/19 2331 02/07/19 0743 02/07/19 1426  BP: 117/77 115/82 120/82 120/82  Pulse: 71 75 66 75  Resp:  16 18 18   Temp: 98.1 F (36.7 C) 99 F (37.2 C) 98.2 F (36.8 C) 98.8 F (37.1 C)  TempSrc: Oral Oral  Oral  SpO2: 97% 100% 97% 98%  Weight:      Height:       Weight change:   Intake/Output Summary (Last 24 hours) at 02/07/2019 1921 Last data filed at 02/07/2019 0900 Gross per 24 hour  Intake 120 ml  Output -  Net 120 ml     Exam: Heart:: Regular rate and rhythm, S1S2 present or without murmur or extra heart sounds Lungs: normal and clear to auscultation Abdomen: soft, nontender, normal bowel sounds   Lab Results: CBC Latest Ref Rng & Units 02/07/2019 02/06/2019 02/05/2019  WBC 4.0 - 10.5 K/uL 5.6 5.2 5.7  Hemoglobin 13.0 - 17.0 g/dL 10.4(L) 11.3(L) 12.6(L)  Hematocrit 39.0 - 52.0 % 29.8(L) 32.7(L) 36.7(L)  Platelets 150 - 400 K/uL 87(L) 94(L) 117(L)   CMP Latest Ref Rng & Units 02/07/2019 02/06/2019 02/05/2019  Glucose 70 - 99 mg/dL 194(H) 273(H) 551(HH)  BUN 6 - 20 mg/dL 6 <5(L) 8  Creatinine 0.61 - 1.24 mg/dL 0.55(L) 0.49(L) 0.82  Sodium 135 - 145 mmol/L 138 137 132(L)  Potassium 3.5 - 5.1 mmol/L 3.6 3.8 4.1  Chloride 98 - 111 mmol/L 110 105 95(L)  CO2 22 - 32 mmol/L 22 25 26   Calcium 8.9 - 10.3 mg/dL 7.3(L) 7.5(L) 8.5(L)  Total Protein 6.5 - 8.1 g/dL 4.5(L) 4.9(L) 6.3(L)  Total Bilirubin 0.3 - 1.2 mg/dL 1.5(H) 2.5(H) 2.2(H)  Alkaline Phos 38 - 126 U/L 58 59 70  AST 15 - 41 U/L 264(H) 824(H) 2,910(H)  ALT 0 - 44 U/L 1,381(H) 2,212(H) 3,930(H)     Micro Results: Recent Results (from the past 240 hour(s))  SARS CORONAVIRUS 2 (TAT 6-24 HRS) Nasopharyngeal Nasopharyngeal Swab     Status: None   Collection Time: 02/05/19  5:38 PM   Specimen: Nasopharyngeal Swab  Result Value Ref Range Status   SARS Coronavirus 2 NEGATIVE NEGATIVE Final    Comment: (NOTE) SARS-CoV-2 target nucleic acids are NOT DETECTED. The SARS-CoV-2 RNA is generally detectable in upper and lower respiratory specimens during the acute phase of infection. Negative results do not preclude SARS-CoV-2 infection, do not rule out co-infections with other pathogens, and should not be used as the sole basis for treatment or other patient management decisions. Negative results must be combined with clinical observations, patient history, and epidemiological information. The expected result is Negative. Fact Sheet for Patients: SugarRoll.be Fact Sheet for Healthcare Providers: https://www.woods-mathews.com/ This test is not yet approved or cleared by the [place] FDA and  has been authorized for detection and/or diagnosis of SARS-CoV-2 by FDA under an Emergency Use Authorization (EUA). This EUA will remain  in effect (meaning this test can be used) for the duration of the COVID-19 declaration under Section 56 4(b)(1) of the Act, 21 U.S.C. section 360bbb-3(b)(1), unless the authorization  is terminated or revoked sooner. Performed at [facility], [place] [address] [place], [place] [address]    Studies/Results: Ct Abdomen Pelvis W Wo Contrast  Result Date: 02/07/2019 CLINICAL DATA:  Cirrhosis. History of polysubstance abuse and alcohol abuse. Elevated liver transaminases EXAM: CT ABDOMEN AND PELVIS WITHOUT AND WITH CONTRAST TECHNIQUE: Multidetector CT imaging of the abdomen and pelvis was performed following the standard protocol before and following the bolus administration of intravenous contrast. CONTRAST:  163mL  OMNIPAQUE IOHEXOL 300 MG/ML  SOLN COMPARISON:  Abdominal sonogram 02/05/2019. FINDINGS: Lower chest: Small bilateral pleural effusions, right greater than left. Hepatobiliary: Hepatic steatosis identified. Normal contour of the liver. No focal enhancing liver abnormality identified. No gallstones identified. Diffuse gallbladder wall edema is noted. No biliary ductal dilatation. Pancreas: Unremarkable. No pancreatic ductal dilatation or surrounding inflammatory changes. Spleen: Normal in size without focal abnormality. Adrenals/Urinary Tract: Normal appearance of the adrenal glands. There are several small cortical based low-density foci within the right kidney measuring less than 1 cm and therefore are too small to reliably characterize. No hydronephrosis identified bilaterally. Urinary bladder appears normal. Stomach/Bowel: Stomach is within normal limits. Appendix appears normal. No evidence of bowel wall thickening, distention, or inflammatory changes. Distal colonic diverticulosis identified without acute inflammation. Vascular/Lymphatic: Aortic atherosclerosis. No aneurysm. The portal vein and splenic vein are patent. The intrahepatic veins and IVC are patent. No abdominopelvic adenopathy. Reproductive: Prostate is unremarkable. Other: There is a small to moderate volume of ascites identified. Small fat containing inguinal hernias. Musculoskeletal: No acute or significant osseous findings. IMPRESSION: 1. Diffuse hepatic steatosis.  No focal liver abnormality noted. 2. Diffuse gallbladder wall edema. Nonspecific in the setting of cirrhosis and ascites. 3. Ascites 4. Small bilateral pleural effusions. 5.  Aortic Atherosclerosis (ICD10-I70.0). Electronically Signed   By: Kerby Moors M.D.   On: 02/07/2019 14:54   US Abdomen Limited  Result Date: 02/07/2019 CLINICAL DATA:  Evaluate for ascites and possible paracentesis. EXAM: LIMITED ABDOMEN ULTRASOUND FOR ASCITES TECHNIQUE: Limited ultrasound survey for  ascites was performed in all four abdominal quadrants. COMPARISON:  Ultrasound 07/07/2018 and CT abdomen 02/07/2019 FINDINGS: Trace perihepatic ascites. Trace perisplenic ascites. No significant fluid in the left lower quadrant. IMPRESSION: Trace upper abdominal ascites. Paracentesis not performed due to the small ascites volume. Electronically Signed   By: Markus Daft M.D.   On: 02/07/2019 16:05   Medications:  I have reviewed the patient's current medications. Prior to Admission:  No medications prior to admission.   Scheduled: . aspirin EC  81 mg Oral Daily  . colchicine  0.6 mg Oral Daily  . insulin aspart  0-15 Units Subcutaneous TID WC  . insulin aspart  0-5 Units Subcutaneous QHS  . insulin aspart  3 Units Subcutaneous TID WC  . insulin detemir  25 Units Subcutaneous Daily  . lisinopril  5 mg Oral Daily  . loratadine  10 mg Oral Daily  . pantoprazole  40 mg Oral Daily  . [START ON 02/08/2019] phytonadione  5 mg Oral Daily  . sertraline  100 mg Oral Daily   Continuous: . sodium chloride 75 mL/hr at 02/06/19 [id number]   [id number] sodium, gabapentin Anti-infectives (From admission, onward)   None     Scheduled Meds: . aspirin EC  81 mg Oral Daily  . colchicine  0.6 mg Oral Daily  . insulin aspart  0-15 Units Subcutaneous TID WC  . insulin aspart  0-5 Units Subcutaneous QHS  . insulin aspart  3 Units Subcutaneous TID WC  .  insulin detemir  25 Units Subcutaneous Daily  . lisinopril  5 mg Oral Daily  . loratadine  10 mg Oral Daily  . pantoprazole  40 mg Oral Daily  . [START ON 02/08/2019] phytonadione  5 mg Oral Daily  . sertraline  100 mg Oral Daily   Continuous Infusions: . sodium chloride 75 mL/hr at 02/06/19 0738   PRN Meds:.docusate sodium, gabapentin   Assessment: Principal Problem:   Elevated LFTs  LFTs continue to improve Fluid inadequate to perform diagnostic paracentesis on limited US Patient reports being treated for hep C about 2 years ago He has been  drinking alcohol but cut back recently  Plan: Follow-up on secondary liver disease work-up  Advised him about tight control of diabetes Recheck LFTs in 1 week at [facility] clinic Follow-up with GI in 2 weeks for further management Patient can be discharged home today Advised patient to completely abstain alcohol and other drugs   LOS: 2 days   Briauna Gilmartin 02/07/2019, 7:21 PM

## 2019-02-08 LAB — CERULOPLASMIN: Ceruloplasmin: 15.2 mg/dL — ABNORMAL LOW (ref 16.0–31.0)

## 2019-02-08 LAB — VARICELLA-ZOSTER BY PCR: Varicella-Zoster, PCR: NEGATIVE

## 2019-02-08 LAB — PARATHYROID HORMONE, INTACT (NO CA): PTH: 21 pg/mL (ref 15–65)

## 2019-02-08 LAB — AFP TUMOR MARKER: AFP, Serum, Tumor Marker: 12.6 ng/mL — ABNORMAL HIGH (ref 0.0–8.3)

## 2019-02-08 LAB — ANA W/REFLEX IF POSITIVE: Anti Nuclear Antibody (ANA): NEGATIVE

## 2019-02-09 LAB — HCV RNA BY PCR, QN RFX GENO
HCV RNA Qnt(log copy/mL): UNDETERMINED log10 IU/mL
HepC Qn: NOT DETECTED IU/mL

## 2019-02-10 ENCOUNTER — Telehealth: Payer: Self-pay

## 2019-02-10 LAB — ANTI-SMOOTH MUSCLE ANTIBODY, IGG: F-Actin IgG: 22 Units — ABNORMAL HIGH (ref 0–19)

## 2019-02-10 LAB — MITOCHONDRIAL ANTIBODIES: Mitochondrial M2 Ab, IgG: <20 Units (ref 0.0–20.0)

## 2019-02-10 NOTE — Telephone Encounter (Signed)
-----   Message from Lin Landsman, MD sent at 02/10/2019 10:32 AM EDT ----- Some of the labs related to his underlying liver condition came back abnormal.  I would like to see him as soon as possible if patient is willing to come early before his actual appointment in October  Dennis Rowland

## 2019-02-10 NOTE — Telephone Encounter (Signed)
Patient verbalized understanding. Patient states he has some other appointments and can not be seen till 03/02/2019.

## 2019-02-11 LAB — ALPHA-1 ANTITRYPSIN PHENOTYPE: A-1 Antitrypsin, Ser: 151 mg/dL (ref 101–187)

## 2019-02-13 ENCOUNTER — Ambulatory Visit (INDEPENDENT_AMBULATORY_CARE_PROVIDER_SITE_OTHER): Payer: 59 | Admitting: Podiatry

## 2019-02-13 ENCOUNTER — Other Ambulatory Visit: Payer: Self-pay

## 2019-02-13 ENCOUNTER — Encounter: Payer: Self-pay | Admitting: Podiatry

## 2019-02-13 DIAGNOSIS — E1142 Type 2 diabetes mellitus with diabetic polyneuropathy: Secondary | ICD-10-CM | POA: Diagnosis not present

## 2019-02-13 DIAGNOSIS — L84 Corns and callosities: Secondary | ICD-10-CM | POA: Diagnosis not present

## 2019-02-13 DIAGNOSIS — M79676 Pain in unspecified toe(s): Secondary | ICD-10-CM | POA: Diagnosis not present

## 2019-02-13 DIAGNOSIS — Q828 Other specified congenital malformations of skin: Secondary | ICD-10-CM | POA: Diagnosis not present

## 2019-02-13 DIAGNOSIS — B351 Tinea unguium: Secondary | ICD-10-CM

## 2019-02-13 NOTE — Progress Notes (Signed)
Complaint:  Visit Type: Patient returns to my office for continued preventative foot care services. Complaint: Patient states" my nails have grown long and thick and become painful to walk and wear shoes" Patient has been diagnosed with DM with no foot complications. The patient presents for preventative foot care services. No changes to ROS  Podiatric Exam: Vascular: dorsalis pedis and posterior tibial pulses are palpable bilateral. Capillary return is immediate. Temperature gradient is WNL. Skin turgor WNL  Sensorium: Normal Semmes Weinstein monofilament test. Normal tactile sensation bilaterally. Nail Exam: Pt has thick disfigured discolored nails with subungual debris noted bilateral entire nail hallux through fifth toenails Ulcer Exam: There is no evidence of ulcer or pre-ulcerative changes or infection. Orthopedic Exam: Muscle tone and strength are WNL. No limitations in general ROM. No crepitus or effusions noted. Foot type and digits show no abnormalities. HAV  B/L. Skin:  Porokeratosis sub 5th right.. No infection or ulcers.  Pinch callus hallux  B/L.  Heel callus  B/l.  Diagnosis:  Onychomycosis, , Pain in right toe, pain in left toes Heel callus  B/L  Pinch callus  B/L  Treatment & Plan Procedures and Treatment: Consent by patient was obtained for treatment procedures.   Debridement of mycotic and hypertrophic toenails, 1 through 5 bilateral and clearing of subungual debris. No ulceration, no infection noted. Debride callus  B/L. Return Visit-Office Procedure: Patient instructed to return to the office for a follow up visit 9 weeks  for continued evaluation and treatment.     Jacquelyn Antony DPM 

## 2019-03-03 ENCOUNTER — Other Ambulatory Visit: Payer: Self-pay

## 2019-03-03 ENCOUNTER — Ambulatory Visit (INDEPENDENT_AMBULATORY_CARE_PROVIDER_SITE_OTHER): Payer: 59 | Admitting: Gastroenterology

## 2019-03-03 ENCOUNTER — Encounter: Payer: Self-pay | Admitting: Gastroenterology

## 2019-03-03 ENCOUNTER — Encounter (INDEPENDENT_AMBULATORY_CARE_PROVIDER_SITE_OTHER): Payer: Self-pay

## 2019-03-03 VITALS — BP 157/76 | HR 81 | Temp 98.8°F | Resp 17 | Ht 68.0 in | Wt 143.0 lb

## 2019-03-03 DIAGNOSIS — R772 Abnormality of alphafetoprotein: Secondary | ICD-10-CM | POA: Diagnosis not present

## 2019-03-03 DIAGNOSIS — R634 Abnormal weight loss: Secondary | ICD-10-CM | POA: Diagnosis not present

## 2019-03-03 DIAGNOSIS — D649 Anemia, unspecified: Secondary | ICD-10-CM

## 2019-03-03 DIAGNOSIS — I8500 Esophageal varices without bleeding: Secondary | ICD-10-CM

## 2019-03-03 DIAGNOSIS — R7989 Other specified abnormal findings of blood chemistry: Secondary | ICD-10-CM

## 2019-03-03 DIAGNOSIS — D696 Thrombocytopenia, unspecified: Secondary | ICD-10-CM

## 2019-03-03 NOTE — Progress Notes (Signed)
[name], MD [address]  [address]  [place], [place] [address]  Main: [phone]  Fax: [phone]    Gastroenterology Consultation  Referring Provider:     Center, [place] Physician:  [facility], [facility] Primary Gastroenterologist:  Dr. [name] Reason for Consultation:   Elevated LFTs, chronic liver disease        HPI:   Dennis Rowland. is a 58 y.o. male referred by Dr. Domingo Madeira, [facility]  for consultation & management of elevated LFTs.  Patient was recently admitted to [facility] secondary to acute hepatitis, significantly elevated transaminases, AST 2910, ALT 3930, total bilirubin 2.2, alkaline phosphatase 70.  Patient has history of cocaine abuse, alcohol use.  He acknowledges drinking alcohol, he had history of chronic hep C that was treated and confirmed eradication.  During the hospital stay, his transaminases started trending down in 24 hours.  Acute viral hepatitis work-up came back negative for HSV, CMV, VZV, hepatitis A, B.  HIV negative, HCV antibody positive, RNA undetected, serum alcohol, acetaminophen, salicylate levels were negative.  His urine toxicology screen came back negative which was performed 2 days after admission.  Patient also has new onset of thrombocytopenia during this admission.  Has mild normocytic anemia.  He has mildly elevated smooth muscle antibody, ceruloplasmin levels were mildly low, ferritin is very elevated.  Ultrasound liver with Doppler was unremarkable, CT abdomen and pelvis revealed diffuse hepatic steatosis.  His serum AFP levels are significantly elevated  Interval summary Since discharge, patient continues to do well.  He denies drinking alcohol.  He does not have any complaints today  NSAIDs: None  Antiplts/Anticoagulants/Anti thrombotics: None  GI Procedures: Reports having had a colonoscopy in 2017 January when he was incarcerated.  He was told that his colonoscopy was  completely normal.  Past Medical History:  Diagnosis Date  . Arthritis   . Chronic sniffling   . Diabetes ([place])   . Dyspnea    some doe  . GERD (gastroesophageal reflux disease)   . Gout   . Hyperlipidemia   . Hypertension   . Neuropathy     Past Surgical History:  Procedure Laterality Date  . NO PAST SURGERIES     Current Outpatient Medications:  .  allopurinol (ZYLOPRIM) 100 MG tablet, Take 100 mg by mouth daily., Disp: , Rfl:  .  aspirin EC 81 MG tablet, Take 1 tablet (81 mg total) by mouth daily., Disp:  , Rfl:  .  cetirizine (ZYRTEC) 10 MG tablet, Take 1 tablet (10 mg total) by mouth daily., Disp:  , Rfl:  .  colchicine 0.6 MG tablet, Take 0.6 mg by mouth daily. , Disp: , Rfl:  .  gabapentin (NEURONTIN) 300 MG capsule, Take 300 mg by mouth 3 (three) times daily. , Disp: , Rfl:  .  Insulin Detemir (LEVEMIR FLEXTOUCH) 100 UNIT/ML Pen, Inject 40 Units into the skin at bedtime. , Disp: , Rfl:  .  lisinopril (PRINIVIL,ZESTRIL) 5 MG tablet, Take 5 mg by mouth daily. , Disp: , Rfl:  .  metFORMIN (GLUCOPHAGE) 850 MG tablet, Take 850 mg by mouth 2 (two) times daily with a meal. , Disp: , Rfl:  .  pantoprazole (PROTONIX) 40 MG tablet, Take 1 tablet (40 mg total) by mouth daily., Disp:  , Rfl:  .  prazosin (MINIPRESS) 2 MG capsule, Take 2 mg by mouth at bedtime., Disp: , Rfl:  .  sertraline (ZOLOFT) 100 MG tablet, Take  100 mg by mouth daily., Disp: , Rfl:  .  sildenafil (VIAGRA) 100 MG tablet, Take 100 mg by mouth daily as needed for erectile dysfunction., Disp: , Rfl:  .  mupirocin cream (BACTROBAN) 2 %, Apply to affected area 3 times daily (Patient not taking: Reported on 03/03/2019), Disp: 30 g, Rfl: 0    Family History  Problem Relation Age of Onset  . Hypertension Mother      Social History   Tobacco Use  . Smoking status: Current Every Day Smoker  . Smokeless tobacco: Never Used  Substance Use Topics  . Alcohol use: Yes    Alcohol/week: 6.0 standard drinks    Types:  6 Cans of beer per week    Comment: DAILY  . Drug use: Yes    Types: Cocaine, Marijuana    Allergies as of 03/03/2019 - Review Complete 03/03/2019  Allergen Reaction Noted  . Tape Anaphylaxis 08/29/2012  . Dye fdc red [red dye] Nausea And Vomiting 10/01/2013  . Hydrochlorothiazide w-triamterene Other (See Comments) 02/23/2010    Review of Systems:    All systems reviewed and negative except where noted in HPI.   Physical Exam:  BP (!) 157/76 (BP Location: Left Arm, Patient Position: Sitting, Cuff Size: Normal)   Pulse 81   Temp 98.8 F (37.1 C)   Resp 17   Ht 5\' 8"  (1.727 m)   Wt 143 lb (64.9 kg)   BMI 21.74 kg/m  No LMP for male patient.  General:   Alert,  Well-developed, well-nourished, pleasant and cooperative in NAD Head:  Normocephalic and atraumatic. Eyes:  Sclera clear, no icterus.  Prosthetic left eye, conjunctiva pink. Ears:  Normal auditory acuity. Nose:  No deformity, discharge, or lesions. Mouth:  No deformity or lesions,oropharynx pink & moist, absence of teeth Neck:  Supple; no masses or thyromegaly. Lungs:  Respirations even and unlabored.  Clear throughout to auscultation.   No wheezes, crackles, or rhonchi. No acute distress. Heart:  Regular rate and rhythm; no murmurs, clicks, rubs, or gallops. Abdomen:  Normal bowel sounds. Soft, non-tender and non-distended without masses, hepatosplenomegaly or hernias noted.  No guarding or rebound tenderness.   Rectal: Not performed Msk:  Symmetrical without gross deformities. Good, equal movement & strength bilaterally. Pulses:  Normal pulses noted. Extremities:  No clubbing or edema.  No cyanosis. Neurologic:  Alert and oriented x3;  grossly normal neurologically. Skin:  Intact without significant lesions or rashes. No jaundice. Psych:  Alert and cooperative. Normal mood and affect.  Imaging Studies: Reviewed  Assessment and Plan:   Dennis Rowland. is a 58 y.o. male with past history of polysubstance  abuse, cocaine use, alcohol abuse, chronic hep C s/p treatment, confirmed eradication is seen as a follow-up for elevated LFTs and elevated serum AFP levels.  Elevated LFTs, predominantly transaminases, consistent with ischemic hepatitis or acute viral hepatitis Other secondary liver disease work-up came back weakly positive for anti-smooth muscle antibodies, mildly low ceruloplasmin levels, significantly elevated ferritin.  These findings could be in the setting of acute hepatitis.  Therefore, recommend repeating serum ceruloplasmin levels, serum ferritin levels and serum alpha-fetoprotein levels.  Given history of thrombocytopenia, and above biochemical abnormalities for secondary causes of liver, recommend ultrasound-guided liver biopsy and patient is agreeable Recheck LFTs today, check EBV IgM titers Continue to remain abstinent from alcohol, avoid polysubstance use  Thrombocytopenia Recommend EGD to evaluate for portal hypertensive gastropathy, esophageal varices   Follow up in 6 weeks   Dennis Rowland R Dennis Herrero,  MD

## 2019-03-05 LAB — AFP TUMOR MARKER: AFP, Serum, Tumor Marker: 20.2 ng/mL — ABNORMAL HIGH (ref 0.0–8.3)

## 2019-03-05 LAB — TSH: TSH: 0.613 u[IU]/mL (ref 0.450–4.500)

## 2019-03-05 LAB — IRON AND TIBC
Iron Saturation: 78 % (ref 15–55)
Iron: 222 ug/dL — ABNORMAL HIGH (ref 38–169)
Total Iron Binding Capacity: 285 ug/dL (ref 250–450)
UIBC: 63 ug/dL — ABNORMAL LOW (ref 111–343)

## 2019-03-05 LAB — COMPREHENSIVE METABOLIC PANEL
ALT: 40 IU/L (ref 0–44)
AST: 24 IU/L (ref 0–40)
Albumin/Globulin Ratio: 1.4 (ref 1.2–2.2)
Albumin: 3.9 g/dL (ref 3.8–4.9)
Alkaline Phosphatase: 66 IU/L (ref 39–117)
BUN/Creatinine Ratio: 16 (ref 9–20)
BUN: 14 mg/dL (ref 6–24)
Bilirubin Total: 1 mg/dL (ref 0.0–1.2)
CO2: 23 mmol/L (ref 20–29)
Calcium: 9.3 mg/dL (ref 8.7–10.2)
Chloride: 98 mmol/L (ref 96–106)
Creatinine, Ser: 0.86 mg/dL (ref 0.76–1.27)
GFR calc Af Amer: 112 mL/min/{1.73_m2} (ref >59)
GFR calc non Af Amer: 97 mL/min/{1.73_m2} (ref >59)
Globulin, Total: 2.7 g/dL (ref 1.5–4.5)
Glucose: 334 mg/dL — ABNORMAL HIGH (ref 65–99)
Potassium: 4.7 mmol/L (ref 3.5–5.2)
Sodium: 135 mmol/L (ref 134–144)
Total Protein: 6.6 g/dL (ref 6.0–8.5)

## 2019-03-05 LAB — PROTIME-INR
INR: 1.7 — ABNORMAL HIGH (ref 0.9–1.2)
Prothrombin Time: 18.3 s — ABNORMAL HIGH (ref 9.1–12.0)

## 2019-03-05 LAB — CBC
Hematocrit: 40.9 % (ref 37.5–51.0)
Hemoglobin: 13.5 g/dL (ref 13.0–17.7)
MCH: 33 pg (ref 26.6–33.0)
MCHC: 33 g/dL (ref 31.5–35.7)
MCV: 100 fL — ABNORMAL HIGH (ref 79–97)
Platelets: 166 10*3/uL (ref 150–450)
RBC: 4.09 x10E6/uL — ABNORMAL LOW (ref 4.14–5.80)
RDW: 12 % (ref 11.6–15.4)
WBC: 5.1 10*3/uL (ref 3.4–10.8)

## 2019-03-05 LAB — CERULOPLASMIN: Ceruloplasmin: 15.5 mg/dL — ABNORMAL LOW (ref 16.0–31.0)

## 2019-03-05 LAB — B12 AND FOLATE PANEL
Folate: 10.2 ng/mL (ref >3.0)
Vitamin B-12: 1204 pg/mL (ref 232–1245)

## 2019-03-05 LAB — ANTI-SMOOTH MUSCLE ANTIBODY, IGG: Smooth Muscle Ab: 26 Units — ABNORMAL HIGH (ref 0–19)

## 2019-03-05 LAB — FERRITIN: Ferritin: 1679 ng/mL — ABNORMAL HIGH (ref 30–400)

## 2019-03-05 LAB — VITAMIN B1

## 2019-03-05 LAB — HEPATITIS B CORE ANTIBODY, TOTAL: Hep B Core Total Ab: POSITIVE — AB

## 2019-03-05 LAB — HEPATITIS A ANTIBODY, TOTAL: hep A Total Ab: POSITIVE — AB

## 2019-03-05 LAB — HEPATITIS B SURFACE ANTIBODY,QUALITATIVE: Hep B Surface Ab, Qual: REACTIVE

## 2019-03-10 ENCOUNTER — Other Ambulatory Visit
Admission: RE | Admit: 2019-03-10 | Discharge: 2019-03-10 | Disposition: A | Discharge status: Home / Self Care | Payer: 59 | Source: Ambulatory Visit | Attending: Gastroenterology | Admitting: Gastroenterology

## 2019-03-10 ENCOUNTER — Other Ambulatory Visit: Payer: Self-pay

## 2019-03-10 DIAGNOSIS — Z01812 Encounter for preprocedural laboratory examination: Secondary | ICD-10-CM | POA: Insufficient documentation

## 2019-03-10 DIAGNOSIS — Z20828 Contact with and (suspected) exposure to other viral communicable diseases: Secondary | ICD-10-CM | POA: Insufficient documentation

## 2019-03-10 LAB — SARS CORONAVIRUS 2 (TAT 6-24 HRS): SARS Coronavirus 2: NEGATIVE

## 2019-03-11 ENCOUNTER — Telehealth: Payer: Self-pay | Admitting: Gastroenterology

## 2019-03-11 NOTE — Telephone Encounter (Signed)
Patient called & was given the number to the Endo to get his time for his procedure on Thursday. He was instructed to call Wednesday between 1-3 for arrival time on Thursday. He was also made aware he needed to have someone with him not to take public transportation.

## 2019-03-13 ENCOUNTER — Encounter: Payer: Self-pay | Admitting: *Deleted

## 2019-03-13 ENCOUNTER — Encounter: Payer: Self-pay | Admitting: Anesthesiology

## 2019-03-13 ENCOUNTER — Ambulatory Visit
Admission: RE | Admit: 2019-03-13 | Discharge: 2019-03-13 | Disposition: A | Discharge status: Home / Self Care | Payer: 59 | Attending: Gastroenterology | Admitting: Gastroenterology

## 2019-03-13 ENCOUNTER — Other Ambulatory Visit: Payer: Self-pay

## 2019-03-13 ENCOUNTER — Encounter
Admission: RE | Disposition: A | Discharge status: Home / Self Care | Payer: Self-pay | Source: Home / Self Care | Attending: Gastroenterology

## 2019-03-13 DIAGNOSIS — Z5309 Procedure and treatment not carried out because of other contraindication: Secondary | ICD-10-CM | POA: Insufficient documentation

## 2019-03-13 DIAGNOSIS — I8500 Esophageal varices without bleeding: Secondary | ICD-10-CM | POA: Insufficient documentation

## 2019-03-13 LAB — URINE DRUG SCREEN, QUALITATIVE (ARMC ONLY)
Amphetamines, Ur Screen: NOT DETECTED
Barbiturates, Ur Screen: NOT DETECTED
Benzodiazepine, Ur Scrn: NOT DETECTED
Cannabinoid 50 Ng, Ur ~~LOC~~: NOT DETECTED
Cocaine Metabolite,Ur ~~LOC~~: POSITIVE — AB
MDMA (Ecstasy)Ur Screen: NOT DETECTED
Methadone Scn, Ur: NOT DETECTED
Opiate, Ur Screen: NOT DETECTED
Phencyclidine (PCP) Ur S: NOT DETECTED
Tricyclic, Ur Screen: NOT DETECTED

## 2019-03-13 SURGERY — ESOPHAGOGASTRODUODENOSCOPY (EGD) WITH PROPOFOL
Anesthesia: General

## 2019-03-13 MED ORDER — PROPOFOL 500 MG/50ML IV EMUL
INTRAVENOUS | Status: AC
  Filled 2019-03-13: qty 50

## 2019-03-13 MED ORDER — SODIUM CHLORIDE 0.9 % IV SOLN: INTRAVENOUS | Status: DC

## 2019-03-13 NOTE — H&P (Signed)
Patient tested positive for cocaine on urine drug screen today.  Therefore, EGD is canceled and is not safe to perform upper endoscopy with moderate sedation Patient is informed about the same  [name], MD [place]  [place], [place] [address]  Main: [phone]  Fax: [phone] Pager: [phone]

## 2019-03-16 ENCOUNTER — Other Ambulatory Visit: Payer: Self-pay | Admitting: Radiology

## 2019-03-18 ENCOUNTER — Ambulatory Visit
Admission: RE | Admit: 2019-03-18 | Discharge: 2019-03-18 | Disposition: A | Discharge status: Home / Self Care | Payer: 59 | Source: Ambulatory Visit | Attending: Gastroenterology | Admitting: Gastroenterology

## 2019-03-26 LAB — SPECIMEN STATUS REPORT

## 2019-03-26 LAB — EBV VCA/EA AB, IGG
EBV Early Antigen Ab, IgG: <9 U/mL (ref 0.0–8.9)
EBV VCA IgG: >600 U/mL — ABNORMAL HIGH (ref 0.0–17.9)

## 2019-03-26 LAB — EPSTEIN-BARR VIRUS VCA, IGM: EBV VCA IgM: <36 U/mL (ref 0.0–35.9)

## 2019-04-22 ENCOUNTER — Ambulatory Visit: Payer: 59 | Admitting: Gastroenterology

## 2019-04-22 ENCOUNTER — Encounter: Payer: Self-pay | Admitting: Gastroenterology

## 2019-04-24 ENCOUNTER — Ambulatory Visit: Payer: 59 | Admitting: Podiatry

## 2019-06-05 ENCOUNTER — Ambulatory Visit (INDEPENDENT_AMBULATORY_CARE_PROVIDER_SITE_OTHER): Payer: 59 | Admitting: Podiatry

## 2019-06-05 ENCOUNTER — Other Ambulatory Visit: Payer: Self-pay

## 2019-06-05 ENCOUNTER — Encounter: Payer: Self-pay | Admitting: Podiatry

## 2019-06-05 DIAGNOSIS — B351 Tinea unguium: Secondary | ICD-10-CM

## 2019-06-05 DIAGNOSIS — M2010 Hallux valgus (acquired), unspecified foot: Secondary | ICD-10-CM

## 2019-06-05 DIAGNOSIS — Q828 Other specified congenital malformations of skin: Secondary | ICD-10-CM

## 2019-06-05 DIAGNOSIS — L84 Corns and callosities: Secondary | ICD-10-CM | POA: Diagnosis not present

## 2019-06-05 DIAGNOSIS — M79676 Pain in unspecified toe(s): Secondary | ICD-10-CM

## 2019-06-05 NOTE — Progress Notes (Signed)
Complaint:  Visit Type: Patient returns to my office for continued preventative foot care services. Complaint: Patient states" my nails have grown long and thick and become painful to walk and wear shoes" Patient has been diagnosed with DM with no foot complications. The patient presents for preventative foot care services. No changes to ROS  Podiatric Exam: Vascular: dorsalis pedis and posterior tibial pulses are palpable bilateral. Capillary return is immediate. Temperature gradient is WNL. Skin turgor WNL  Sensorium: Normal Semmes Weinstein monofilament test. Normal tactile sensation bilaterally. Nail Exam: Pt has thick disfigured discolored nails with subungual debris noted bilateral entire nail hallux through fifth toenails Ulcer Exam: There is no evidence of ulcer or pre-ulcerative changes or infection. Orthopedic Exam: Muscle tone and strength are WNL. No limitations in general ROM. No crepitus or effusions noted. Foot type and digits show no abnormalities. HAV  B/L. Skin:  Porokeratosis sub 5th right.. No infection or ulcers.  Pinch callus hallux  B/L.  Heel callus  B/l. Callus sub 5th right foot.  Diagnosis:  Onychomycosis, , Pain in right toe, pain in left toes Heel callus  B/L  Pinch callus  B/L Callus sub 5th right foot.  Treatment & Plan Procedures and Treatment: Consent by patient was obtained for treatment procedures.   Debridement of mycotic and hypertrophic toenails, 1 through 5 bilateral and clearing of subungual debris. No ulceration, no infection noted. Debride callus  B/L. Return Visit-Office Procedure: Patient instructed to return to the office for a follow up visit 9 weeks  for continued evaluation and treatment.     Gardiner Barefoot DPM

## 2019-08-07 ENCOUNTER — Ambulatory Visit: Payer: 59 | Admitting: Podiatry

## 2019-08-07 ENCOUNTER — Other Ambulatory Visit: Payer: Self-pay

## 2019-08-11 ENCOUNTER — Ambulatory Visit (INDEPENDENT_AMBULATORY_CARE_PROVIDER_SITE_OTHER): Payer: 59 | Admitting: Podiatry

## 2019-08-11 ENCOUNTER — Encounter: Payer: Self-pay | Admitting: Podiatry

## 2019-08-11 ENCOUNTER — Other Ambulatory Visit: Payer: Self-pay

## 2019-08-11 VITALS — Temp 97.3°F

## 2019-08-11 DIAGNOSIS — M2010 Hallux valgus (acquired), unspecified foot: Secondary | ICD-10-CM | POA: Diagnosis not present

## 2019-08-11 DIAGNOSIS — E1142 Type 2 diabetes mellitus with diabetic polyneuropathy: Secondary | ICD-10-CM

## 2019-08-11 DIAGNOSIS — L84 Corns and callosities: Secondary | ICD-10-CM

## 2019-08-11 DIAGNOSIS — Q828 Other specified congenital malformations of skin: Secondary | ICD-10-CM | POA: Diagnosis not present

## 2019-08-11 DIAGNOSIS — B351 Tinea unguium: Secondary | ICD-10-CM | POA: Diagnosis not present

## 2019-08-11 DIAGNOSIS — M79676 Pain in unspecified toe(s): Secondary | ICD-10-CM | POA: Diagnosis not present

## 2019-08-11 NOTE — Progress Notes (Signed)
This patient returns to the office for evaluation and treatment of long thick painful nails .  This patient is unable to trim his own nails since the patient cannot reach the feet.  Patient says the nails are painful walking and wearing his shoes.  He also has callus heel  B/L and callus right big toe and under his right forefoot.  These calluses are painful walking and wearing his shoes. He returns for preventive foot care services.  General Appearance  Alert, conversant and in no acute stress.  Vascular  Dorsalis pedis and posterior tibial  pulses are palpable  bilaterally.  Capillary return is within normal limits  bilaterally. Temperature is within normal limits  bilaterally.  Neurologic  Senn-Weinstein monofilament wire test within normal limits  bilaterally. Muscle power within normal limits bilaterally.  Nails Thick disfigured discolored nails with subungual debris  from hallux to fifth toes bilaterally. No evidence of bacterial infection or drainage bilaterally.  Orthopedic  No limitations of motion  feet .  No crepitus or effusions noted.  No bony pathology or digital deformities noted.  Skin  normotropic skin with no porokeratosis noted bilaterally.  No signs of infections or ulcers noted.   Pinch callus right hallux.  Porokeratosis sub 5th right foot.  Callus lateral aspect heel  B/L.  Onychomycosis  Pain in toes right foot  Pain in toes left foot  Debridement  of nails  1-5  B/L with a nail nipper.  Nails were then filed using a dremel tool with no incidents.    RTC 10 weeks. Debride callus with # 15 blade.   Adyline Huberty DPM  

## 2019-10-20 ENCOUNTER — Other Ambulatory Visit: Payer: Self-pay

## 2019-10-20 ENCOUNTER — Encounter: Payer: Self-pay | Admitting: Podiatry

## 2019-10-20 ENCOUNTER — Ambulatory Visit (INDEPENDENT_AMBULATORY_CARE_PROVIDER_SITE_OTHER): Payer: 59 | Admitting: Podiatry

## 2019-10-20 VITALS — Temp 97.7°F

## 2019-10-20 DIAGNOSIS — E1142 Type 2 diabetes mellitus with diabetic polyneuropathy: Secondary | ICD-10-CM | POA: Diagnosis not present

## 2019-10-20 DIAGNOSIS — L84 Corns and callosities: Secondary | ICD-10-CM | POA: Diagnosis not present

## 2019-10-20 DIAGNOSIS — M79676 Pain in unspecified toe(s): Secondary | ICD-10-CM

## 2019-10-20 DIAGNOSIS — B351 Tinea unguium: Secondary | ICD-10-CM | POA: Diagnosis not present

## 2019-10-20 DIAGNOSIS — Q828 Other specified congenital malformations of skin: Secondary | ICD-10-CM | POA: Diagnosis not present

## 2019-10-20 NOTE — Progress Notes (Signed)
This patient returns to the office for evaluation and treatment of long thick painful nails .  This patient is unable to trim his own nails since the patient cannot reach the feet.  Patient says the nails are painful walking and wearing his shoes.  He also has callus heel  B/L and callus right big toe and under his right forefoot.  These calluses are painful walking and wearing his shoes. He returns for preventive foot care services.  General Appearance  Alert, conversant and in no acute stress.  Vascular  Dorsalis pedis and posterior tibial  pulses are palpable  bilaterally.  Capillary return is within normal limits  bilaterally. Temperature is within normal limits  bilaterally.  Neurologic  Senn-Weinstein monofilament wire test within normal limits  bilaterally. Muscle power within normal limits bilaterally.  Nails Thick disfigured discolored nails with subungual debris  from hallux to fifth toes bilaterally. No evidence of bacterial infection or drainage bilaterally.  Orthopedic  No limitations of motion  feet .  No crepitus or effusions noted.  No bony pathology or digital deformities noted.  Skin  normotropic skin with no porokeratosis noted bilaterally.  No signs of infections or ulcers noted.   Pinch callus right hallux.  Porokeratosis sub 5th right foot.  Callus lateral aspect heel  B/L.  Onychomycosis  Pain in toes right foot  Pain in toes left foot  Debridement  of nails  1-5  B/L with a nail nipper.  Nails were then filed using a dremel tool with no incidents.    RTC 10 weeks. Debride callus with # 15 blade.   Gardiner Barefoot DPM

## 2019-10-29 ENCOUNTER — Ambulatory Visit (INDEPENDENT_AMBULATORY_CARE_PROVIDER_SITE_OTHER): Payer: 59 | Admitting: Gastroenterology

## 2019-10-29 ENCOUNTER — Other Ambulatory Visit: Payer: Self-pay

## 2019-10-29 ENCOUNTER — Encounter: Payer: Self-pay | Admitting: Gastroenterology

## 2019-10-29 VITALS — BP 124/87 | HR 87 | Temp 98.4°F | Ht 68.0 in | Wt 137.0 lb

## 2019-10-29 DIAGNOSIS — G8929 Other chronic pain: Secondary | ICD-10-CM

## 2019-10-29 DIAGNOSIS — R634 Abnormal weight loss: Secondary | ICD-10-CM | POA: Diagnosis not present

## 2019-10-29 DIAGNOSIS — R1013 Epigastric pain: Secondary | ICD-10-CM | POA: Diagnosis not present

## 2019-10-29 DIAGNOSIS — K529 Noninfective gastroenteritis and colitis, unspecified: Secondary | ICD-10-CM

## 2019-10-29 NOTE — Progress Notes (Signed)
[name], MD [address]  [place],  [address]  Main: [phone]  Fax: [phone]    Gastroenterology Consultation  Referring Provider:     [facility], [place] Physician:  [facility], [facility] Primary Gastroenterologist:  Dr. [name] Reason for Consultation:   Elevated LFTs, chronic liver disease        HPI:   Kermitt Harjo. is a 59 y.o. male referred by Dr. Domingo Madeira, [facility]  for consultation & management of elevated LFTs.  Patient was recently admitted to [facility] secondary to acute hepatitis, significantly elevated transaminases, AST 2910, ALT 3930, total bilirubin 2.2, alkaline phosphatase 70.  Patient has history of cocaine abuse, alcohol use.  He acknowledges drinking alcohol, he had history of chronic hep C that was treated and confirmed eradication.  During the hospital stay, his transaminases started trending down in 24 hours.  Acute viral hepatitis work-up came back negative for HSV, CMV, VZV, hepatitis A, B.  HIV negative, HCV antibody positive, RNA undetected, serum alcohol, acetaminophen, salicylate levels were negative.  His urine toxicology screen came back negative which was performed 2 days after admission.  Patient also has new onset of thrombocytopenia during this admission.  Has mild normocytic anemia.  He has mildly elevated smooth muscle antibody, ceruloplasmin levels were mildly low, ferritin is very elevated.  Ultrasound liver with Doppler was unremarkable, CT abdomen and pelvis revealed diffuse hepatic steatosis.  His serum AFP levels are significantly elevated  Interval summary Since discharge, patient continues to do well.  He denies drinking alcohol.  He does not have any complaints today  Follow-up visit 10/29/2019 He reports epigastric and left upper quadrant pain for more than 6 months radiating to back, associated with bloating, unintentional weight loss, increased bowel  frequency.  He denies nausea, vomiting, rectal bleeding, melena. He reports that he has been drinking lot of milk to alleviate pain.  He is also on omeprazole 20 mg and Protonix 40 mg, takes every other day.  He also reports tingling and numbness, or weakness in his upper extremities.  He reports his appetite is good.  Upper endoscopy was canceled last time because he was cocaine positive on urine tox screen.  Patient did not undergo liver biopsy as recommended.  NSAIDs: None  Antiplts/Anticoagulants/Anti thrombotics: None  GI Procedures: Reports having had a colonoscopy in 2017 January when he was incarcerated.  He was told that his colonoscopy was completely normal.  Past Medical History:  Diagnosis Date   Arthritis    Chronic sniffling    Diabetes ([place])    Dyspnea    some doe   GERD (gastroesophageal reflux disease)    Gout    Hyperlipidemia    Hypertension    Neuropathy     Past Surgical History:  Procedure Laterality Date   NO PAST SURGERIES     Current Outpatient Medications:    allopurinol (ZYLOPRIM) 100 MG tablet, Take 100 mg by mouth daily., Disp: , Rfl:    aspirin EC 81 MG tablet, Take 1 tablet (81 mg total) by mouth daily., Disp:  , Rfl:    cetirizine (ZYRTEC) 10 MG tablet, Take 1 tablet (10 mg total) by mouth daily., Disp:  , Rfl:    colchicine 0.6 MG tablet, Take 0.6 mg by mouth daily. , Disp: , Rfl:    gabapentin (NEURONTIN) 300 MG capsule, Take 300 mg by mouth 3 (three) times daily. , Disp: , Rfl:  Insulin Detemir (LEVEMIR FLEXTOUCH) 100 UNIT/ML Pen, Inject 40 Units into the skin at bedtime. , Disp: , Rfl:    lisinopril (PRINIVIL,ZESTRIL) 5 MG tablet, Take 5 mg by mouth daily. , Disp: , Rfl:    meloxicam (MOBIC) 7.5 MG tablet, , Disp: , Rfl:    metFORMIN (GLUCOPHAGE) 850 MG tablet, Take 850 mg by mouth 2 (two) times daily with a meal. , Disp: , Rfl:    omeprazole (PRILOSEC) 20 MG capsule, Take 20 mg by mouth daily., Disp: , Rfl:     pantoprazole (PROTONIX) 40 MG tablet, Take 1 tablet (40 mg total) by mouth daily., Disp:  , Rfl:    prazosin (MINIPRESS) 2 MG capsule, Take 2 mg by mouth at bedtime., Disp: , Rfl:    sertraline (ZOLOFT) 100 MG tablet, Take 100 mg by mouth daily., Disp: , Rfl:    sildenafil (VIAGRA) 100 MG tablet, Take 100 mg by mouth daily as needed for erectile dysfunction., Disp: , Rfl:     Family History  Problem Relation Age of Onset   Hypertension Mother      Social History   Tobacco Use   Smoking status: Current Every Day Smoker   Smokeless tobacco: Never Used  Vaping Use   Vaping Use: Never used  Substance Use Topics   Alcohol use: Yes    Alcohol/week: 6.0 standard drinks    Types: 6 Cans of beer per week    Comment: DAILY   Drug use: Yes    Types: Cocaine, Marijuana    Comment: "it has been a while"    Allergies as of 10/29/2019 - Review Complete 10/29/2019  Allergen Reaction Noted   Tape Anaphylaxis 08/29/2012   Dye fdc red [red dye] Nausea And Vomiting 10/01/2013   Hydrochlorothiazide w-triamterene Other (See Comments) 02/23/2010    Review of Systems:    All systems reviewed and negative except where noted in HPI.   Physical Exam:  BP 124/87 (BP Location: Left Arm, Patient Position: Sitting, Cuff Size: Normal)    Pulse 87    Temp 98.4 F (36.9 C) (Oral)    Ht 5\' 8"  (1.727 m)    Wt 137 lb (62.1 kg)    BMI 20.83 kg/m  No LMP for male patient.  General:   Alert, thin built, moderately nourished, pleasant and cooperative in NAD Head:  Normocephalic and atraumatic. Eyes:  Sclera clear, no icterus.  Prosthetic left eye, conjunctiva pink. Ears:  Normal auditory acuity. Nose:  No deformity, discharge, or lesions. Mouth:  No deformity or lesions,oropharynx pink & moist, absence of teeth Neck:  Supple; no masses or thyromegaly. Lungs:  Respirations even and unlabored.  Clear throughout to auscultation.   No wheezes, crackles, or rhonchi. No acute distress. Heart:   Regular rate and rhythm; no murmurs, clicks, rubs, or gallops. Abdomen:  Normal bowel sounds. Soft, mild epigastric tenderness, moderately distended, tympanic to percussion, without masses, hepatosplenomegaly or hernias noted.  No guarding or rebound tenderness.   Rectal: Not performed Msk:  Symmetrical without gross deformities. Good, equal movement & strength bilaterally. Pulses:  Normal pulses noted. Extremities:  No clubbing or edema.  No cyanosis. Neurologic:  Alert and oriented x3;  grossly normal neurologically. Skin:  Intact without significant lesions or rashes. No jaundice. Psych:  Alert and cooperative. Normal mood and affect.  Imaging Studies: Reviewed  Assessment and Plan:   Terelle Dobler. is a 59 y.o. male with past history of polysubstance abuse, cocaine use, alcohol abuse, chronic hep  C s/p treatment, confirmed eradication is seen as a follow-up for chronic epigastric pain associated with abdominal bloating, nonbloody diarrhea and unintentional weight loss   Chronic epigastric pain, abdominal bloating, nonbloody diarrhea, unintentional weight loss: Differentials include peptic ulcer disease or pancreatic insufficiency or malignancy or IBD Recommend EGD with gastric biopsies for further evaluation, strongly advised patient to remain abstinent from cocaine Stool studies to rule out infection Check fecal calprotectin levels Check pancreatic fecal elastase levels Continue omeprazole or Protonix 2 times daily before meals Trial of Creon 72K with each meal and 36K with snack, samples provided  Peripheral neuropathy Check B12, folate levels, thiamine, vitamin D, TSH Advised patient to start taking multivitamin   Elevated LFTs, predominantly transaminases, consistent with ischemic hepatitis or acute viral hepatitis Other secondary liver disease work-up came back weakly positive for anti-smooth muscle antibodies, mildly low ceruloplasmin levels, significantly elevated  ferritin.  These findings could be in the setting of acute hepatitis.  Repeat LFTs returned normal in 02/2019.  However serum AFP and ferritin levels were elevated, ceruloplasmin levels were low.  Patient did not undergo liver biopsy as recommended Recheck LFTs and serum AFP levels today, if still elevated recommend liver biopsy Continue to remain abstinent from alcohol, avoid polysubstance use  Thrombocytopenia: Resolved Was likely secondary to bone marrow suppression in setting of alcohol use   Follow up in 2 months  [name], MD

## 2019-10-29 NOTE — Patient Instructions (Addendum)
Creon Take 2 capsules with the first bite of every meal and 1 capsule with the first bite of each snack

## 2019-10-31 ENCOUNTER — Encounter: Payer: Self-pay | Admitting: Family Medicine

## 2019-11-01 LAB — COMPREHENSIVE METABOLIC PANEL
ALT: 21 IU/L (ref 0–44)
AST: 20 IU/L (ref 0–40)
Albumin/Globulin Ratio: 1.6 (ref 1.2–2.2)
Albumin: 3.9 g/dL (ref 3.8–4.9)
Alkaline Phosphatase: 73 IU/L (ref 48–121)
BUN/Creatinine Ratio: 18 (ref 9–20)
BUN: 12 mg/dL (ref 6–24)
Bilirubin Total: 0.4 mg/dL (ref 0.0–1.2)
CO2: 20 mmol/L (ref 20–29)
Calcium: 9.3 mg/dL (ref 8.7–10.2)
Chloride: 102 mmol/L (ref 96–106)
Creatinine, Ser: 0.65 mg/dL — ABNORMAL LOW (ref 0.76–1.27)
GFR calc Af Amer: 125 mL/min/{1.73_m2} (ref >59)
GFR calc non Af Amer: 108 mL/min/{1.73_m2} (ref >59)
Globulin, Total: 2.5 g/dL (ref 1.5–4.5)
Glucose: 299 mg/dL — ABNORMAL HIGH (ref 65–99)
Potassium: 4.5 mmol/L (ref 3.5–5.2)
Sodium: 138 mmol/L (ref 134–144)
Total Protein: 6.4 g/dL (ref 6.0–8.5)

## 2019-11-01 LAB — CBC
Hematocrit: 37.3 % — ABNORMAL LOW (ref 37.5–51.0)
Hemoglobin: 12.7 g/dL — ABNORMAL LOW (ref 13.0–17.7)
MCH: 35 pg — ABNORMAL HIGH (ref 26.6–33.0)
MCHC: 34 g/dL (ref 31.5–35.7)
MCV: 103 fL — ABNORMAL HIGH (ref 79–97)
Platelets: 143 10*3/uL — ABNORMAL LOW (ref 150–450)
RBC: 3.63 x10E6/uL — ABNORMAL LOW (ref 4.14–5.80)
RDW: 13.1 % (ref 11.6–15.4)
WBC: 5.3 10*3/uL (ref 3.4–10.8)

## 2019-11-01 LAB — IRON,TIBC AND FERRITIN PANEL
Ferritin: 1121 ng/mL — ABNORMAL HIGH (ref 30–400)
Iron Saturation: 35 % (ref 15–55)
Iron: 102 ug/dL (ref 38–169)
Total Iron Binding Capacity: 294 ug/dL (ref 250–450)
UIBC: 192 ug/dL (ref 111–343)

## 2019-11-01 LAB — B12 AND FOLATE PANEL
Folate: 8.4 ng/mL (ref >3.0)
Vitamin B-12: 1180 pg/mL (ref 232–1245)

## 2019-11-01 LAB — VITAMIN B1: Thiamine: 90.9 nmol/L (ref 66.5–200.0)

## 2019-11-01 LAB — VITAMIN D 25 HYDROXY (VIT D DEFICIENCY, FRACTURES): Vit D, 25-Hydroxy: 27.7 ng/mL — ABNORMAL LOW (ref 30.0–100.0)

## 2019-11-01 LAB — AFP TUMOR MARKER: AFP, Serum, Tumor Marker: 10.4 ng/mL — ABNORMAL HIGH (ref 0.0–8.3)

## 2019-11-01 LAB — TSH: TSH: 0.784 u[IU]/mL (ref 0.450–4.500)

## 2019-11-01 LAB — HIV ANTIBODY (ROUTINE TESTING W REFLEX): HIV Screen 4th Generation wRfx: NONREACTIVE

## 2019-11-03 ENCOUNTER — Telehealth: Payer: Self-pay

## 2019-11-03 DIAGNOSIS — R1013 Epigastric pain: Secondary | ICD-10-CM

## 2019-11-03 DIAGNOSIS — R772 Abnormality of alphafetoprotein: Secondary | ICD-10-CM

## 2019-11-03 DIAGNOSIS — R197 Diarrhea, unspecified: Secondary | ICD-10-CM

## 2019-11-03 NOTE — Telephone Encounter (Signed)
-----   Message from Lin Landsman, MD sent at 11/03/2019  2:29 PM EDT ----- Elevated AFP levels.  AFP is a tumor marker that represents potential liver cancer.  This does not confirm the presence of liver cancer.  Therefore, recommend MRI liver mass protocolRohini Vanga

## 2019-11-03 NOTE — Telephone Encounter (Signed)
Called and left a message for call back. Order MRI but will call to scheduled when patient calls back

## 2019-11-04 ENCOUNTER — Telehealth: Payer: Self-pay

## 2019-11-04 LAB — GI PROFILE, STOOL, PCR
Adenovirus F 40/41: NOT DETECTED
Astrovirus: NOT DETECTED
C difficile toxin A/B: NOT DETECTED
Campylobacter: NOT DETECTED
Cryptosporidium: NOT DETECTED
Cyclospora cayetanensis: NOT DETECTED
E coli O157: NOT DETECTED
Entamoeba histolytica: NOT DETECTED
Enteroaggregative E coli: NOT DETECTED
Enteropathogenic E coli: NOT DETECTED
Enterotoxigenic E coli: NOT DETECTED
Giardia lamblia: NOT DETECTED
Norovirus GI/GII: NOT DETECTED
Plesiomonas shigelloides: NOT DETECTED
Rotavirus A: NOT DETECTED
Salmonella: NOT DETECTED
Sapovirus: NOT DETECTED
Shiga-toxin-producing E coli: NOT DETECTED
Shigella/Enteroinvasive E coli: NOT DETECTED
Vibrio cholerae: NOT DETECTED
Vibrio: NOT DETECTED
Yersinia enterocolitica: NOT DETECTED

## 2019-11-04 LAB — CALPROTECTIN, FECAL: Calprotectin, Fecal: 32 ug/g (ref 0–120)

## 2019-11-04 LAB — PANCREATIC ELASTASE, FECAL: Pancreatic Elastase, Fecal: 62 ug Elast./g — ABNORMAL LOW (ref >200)

## 2019-11-04 MED ORDER — PANCRELIPASE (LIP-PROT-AMYL) 36000-114000 UNITS PO CPEP: ORAL_CAPSULE | ORAL | 1 refills | Status: DC

## 2019-11-04 NOTE — Telephone Encounter (Signed)
Patient verbalized understanding of results  

## 2019-11-04 NOTE — Telephone Encounter (Signed)
-----   Message from Lin Landsman, MD sent at 11/04/2019  4:55 PM EDT ----- Patient has severe pancreatic insufficiency causing his GI symptoms as well as weight loss.  Recommend to start Creon or Zenpep 2 capsules with first bite of each meal and 1 with snack.  I will see him for follow-up as scheduled  Rohini Vanga

## 2019-11-04 NOTE — Telephone Encounter (Signed)
Patient verbalized understanding. Sent medication to pharmacy. Patient states samples are helping

## 2019-11-04 NOTE — Telephone Encounter (Signed)
Wednesday 11/19/2019 at 9:00am Check in at 8:30am NPO 4 hours prior. Called and patient verbalized understanding of scan time and instructions

## 2019-11-19 ENCOUNTER — Ambulatory Visit
Admission: RE | Admit: 2019-11-19 | Discharge: 2019-11-19 | Disposition: A | Discharge status: Home / Self Care | Payer: 59 | Source: Ambulatory Visit | Attending: Gastroenterology | Admitting: Gastroenterology

## 2019-11-19 ENCOUNTER — Other Ambulatory Visit: Payer: Self-pay

## 2019-11-19 DIAGNOSIS — R1013 Epigastric pain: Secondary | ICD-10-CM | POA: Insufficient documentation

## 2019-11-19 DIAGNOSIS — R197 Diarrhea, unspecified: Secondary | ICD-10-CM | POA: Diagnosis present

## 2019-11-19 DIAGNOSIS — R772 Abnormality of alphafetoprotein: Secondary | ICD-10-CM | POA: Diagnosis present

## 2019-11-19 MED ORDER — GADOBUTROL 1 MMOL/ML IV SOLN
5.0000 mL | Freq: Once | INTRAVENOUS | Status: AC | PRN
  Administered 2019-11-19: 5 mL via INTRAVENOUS

## 2019-11-20 ENCOUNTER — Telehealth: Payer: Self-pay

## 2019-11-20 NOTE — Telephone Encounter (Signed)
[facility] called to make sure we had the results of the patient MR liver results. Patient then called and wanted to know the results. Informed patient that the provider was on vacation but as soon as she reviews the results I would give him a call

## 2019-11-21 ENCOUNTER — Telehealth: Payer: Self-pay

## 2019-11-21 NOTE — Telephone Encounter (Signed)
-----   Message from Lin Landsman, MD sent at 11/20/2019 11:39 PM EDT ----- A small lesion in right kidney is identified. Not clearly suggestive of cancer or anything concerning. Please fax a copy to his PCP and he will need repeat imaging by his PCP in 1 month  RV

## 2019-11-21 NOTE — Telephone Encounter (Signed)
Patient verbalized understanding of results. Faxed results to PCP office

## 2019-11-24 ENCOUNTER — Other Ambulatory Visit: Admission: RE | Admit: 2019-11-24 | Payer: 59 | Source: Ambulatory Visit

## 2019-12-01 ENCOUNTER — Other Ambulatory Visit
Admission: RE | Admit: 2019-12-01 | Discharge: 2019-12-01 | Disposition: A | Discharge status: Home / Self Care | Payer: 59 | Source: Ambulatory Visit | Attending: Gastroenterology | Admitting: Gastroenterology

## 2019-12-01 ENCOUNTER — Other Ambulatory Visit: Payer: Self-pay

## 2019-12-01 DIAGNOSIS — Z01812 Encounter for preprocedural laboratory examination: Secondary | ICD-10-CM | POA: Insufficient documentation

## 2019-12-01 DIAGNOSIS — Z20822 Contact with and (suspected) exposure to covid-19: Secondary | ICD-10-CM | POA: Insufficient documentation

## 2019-12-01 LAB — SARS CORONAVIRUS 2 (TAT 6-24 HRS): SARS Coronavirus 2: NEGATIVE

## 2019-12-03 ENCOUNTER — Encounter: Payer: Self-pay | Admitting: Anesthesiology

## 2019-12-03 ENCOUNTER — Encounter: Payer: Self-pay | Admitting: Gastroenterology

## 2019-12-03 ENCOUNTER — Encounter
Admission: RE | Disposition: A | Discharge status: Home / Self Care | Payer: Self-pay | Source: Home / Self Care | Attending: Gastroenterology

## 2019-12-03 ENCOUNTER — Ambulatory Visit
Admission: RE | Admit: 2019-12-03 | Discharge: 2019-12-03 | Disposition: A | Discharge status: Home / Self Care | Payer: 59 | Attending: Gastroenterology | Admitting: Gastroenterology

## 2019-12-03 DIAGNOSIS — I10 Essential (primary) hypertension: Secondary | ICD-10-CM | POA: Insufficient documentation

## 2019-12-03 DIAGNOSIS — E119 Type 2 diabetes mellitus without complications: Secondary | ICD-10-CM | POA: Insufficient documentation

## 2019-12-03 DIAGNOSIS — E785 Hyperlipidemia, unspecified: Secondary | ICD-10-CM | POA: Diagnosis not present

## 2019-12-03 DIAGNOSIS — F1490 Cocaine use, unspecified, uncomplicated: Secondary | ICD-10-CM | POA: Diagnosis not present

## 2019-12-03 DIAGNOSIS — M1990 Unspecified osteoarthritis, unspecified site: Secondary | ICD-10-CM | POA: Diagnosis not present

## 2019-12-03 DIAGNOSIS — Z791 Long term (current) use of non-steroidal anti-inflammatories (NSAID): Secondary | ICD-10-CM | POA: Diagnosis not present

## 2019-12-03 DIAGNOSIS — Z794 Long term (current) use of insulin: Secondary | ICD-10-CM | POA: Insufficient documentation

## 2019-12-03 DIAGNOSIS — Z8249 Family history of ischemic heart disease and other diseases of the circulatory system: Secondary | ICD-10-CM | POA: Insufficient documentation

## 2019-12-03 DIAGNOSIS — K219 Gastro-esophageal reflux disease without esophagitis: Secondary | ICD-10-CM | POA: Diagnosis not present

## 2019-12-03 DIAGNOSIS — R1013 Epigastric pain: Secondary | ICD-10-CM | POA: Diagnosis present

## 2019-12-03 DIAGNOSIS — Z539 Procedure and treatment not carried out, unspecified reason: Secondary | ICD-10-CM | POA: Insufficient documentation

## 2019-12-03 DIAGNOSIS — Z7982 Long term (current) use of aspirin: Secondary | ICD-10-CM | POA: Diagnosis not present

## 2019-12-03 DIAGNOSIS — F17200 Nicotine dependence, unspecified, uncomplicated: Secondary | ICD-10-CM | POA: Insufficient documentation

## 2019-12-03 DIAGNOSIS — M109 Gout, unspecified: Secondary | ICD-10-CM | POA: Diagnosis not present

## 2019-12-03 DIAGNOSIS — G8929 Other chronic pain: Secondary | ICD-10-CM

## 2019-12-03 DIAGNOSIS — Z79899 Other long term (current) drug therapy: Secondary | ICD-10-CM | POA: Diagnosis not present

## 2019-12-03 LAB — URINE DRUG SCREEN, QUALITATIVE (ARMC ONLY)
Amphetamines, Ur Screen: NOT DETECTED
Barbiturates, Ur Screen: NOT DETECTED
Benzodiazepine, Ur Scrn: NOT DETECTED
Cannabinoid 50 Ng, Ur ~~LOC~~: NOT DETECTED
Cocaine Metabolite,Ur ~~LOC~~: POSITIVE — AB
MDMA (Ecstasy)Ur Screen: NOT DETECTED
Methadone Scn, Ur: NOT DETECTED
Opiate, Ur Screen: NOT DETECTED
Phencyclidine (PCP) Ur S: NOT DETECTED
Tricyclic, Ur Screen: NOT DETECTED

## 2019-12-03 LAB — GLUCOSE, CAPILLARY
Glucose-Capillary: 341 mg/dL — ABNORMAL HIGH (ref 70–99)
Glucose-Capillary: 350 mg/dL — ABNORMAL HIGH (ref 70–99)

## 2019-12-03 SURGERY — ESOPHAGOGASTRODUODENOSCOPY (EGD) WITH PROPOFOL
Anesthesia: General

## 2019-12-03 MED ORDER — LIDOCAINE HCL (PF) 2 % IJ SOLN
INTRAMUSCULAR | Status: AC
  Filled 2019-12-03: qty 5

## 2019-12-03 MED ORDER — PROPOFOL 500 MG/50ML IV EMUL
INTRAVENOUS | Status: AC
  Filled 2019-12-03: qty 50

## 2019-12-03 MED ORDER — SODIUM CHLORIDE 0.9 % IV SOLN: INTRAVENOUS | Status: DC

## 2019-12-03 NOTE — H&P (Signed)
Dennis Darby, MD [address]  Dennis Rowland  [place], [place] [address]  Main: [phone]  Fax: [phone] Pager: [phone]  Primary Care Physician:  [facility] Primary Gastroenterologist:  Dr. Cephas Rowland  Pre-Procedure History & Physical: HPI:  Dennis Row[place]. is a 59 y.o. male is here for an endoscopy.   Past Medical History:  Diagnosis Date  . Arthritis   . Chronic sniffling   . Diabetes ([place])   . Dyspnea    some doe  . GERD (gastroesophageal reflux disease)   . Gout   . Hyperlipidemia   . Hypertension   . Neuropathy     Past Surgical History:  Procedure Laterality Date  . COLONOSCOPY WITH PROPOFOL    . NO PAST SURGERIES      Prior to Admission medications   Medication Sig Start Date End Date Taking? Authorizing Provider  aspirin EC 81 MG tablet Take 1 tablet (81 mg total) by mouth daily. 02/07/19  Yes Henreitta Leber, MD  cetirizine (ZYRTEC) 10 MG tablet Take 1 tablet (10 mg total) by mouth daily. 02/07/19  Yes Henreitta Leber, MD  colchicine 0.6 MG tablet Take 0.6 mg by mouth daily.    Yes [provider]  gabapentin (NEURONTIN) 300 MG capsule Take 300 mg by mouth 3 (three) times daily.    Yes [provider]  Insulin Detemir (LEVEMIR FLEXTOUCH) 100 UNIT/ML Pen Inject 40 Units into the skin at bedtime.    Yes [provider]  lipase/protease/amylase (CREON) 36000 UNITS CPEP capsule Take 2 capsule with the first bite of each meal and 1 capsule with the first bite of snack 11/04/19  Yes Kazzandra Desaulniers, Tally Due, MD  lisinopril (PRINIVIL,ZESTRIL) 5 MG tablet Take 5 mg by mouth daily.  03/02/15  Yes [provider]  meloxicam (MOBIC) 7.5 MG tablet  06/04/19  Yes [provider]  metFORMIN (GLUCOPHAGE) 850 MG tablet Take 850 mg by mouth 2 (two) times daily with a meal.    Yes [provider]  omeprazole (PRILOSEC) 20 MG capsule Take 20 mg by mouth daily. 06/04/19  Yes [provider]  pantoprazole (PROTONIX) 40 MG tablet Take 1 tablet (40 mg total) by mouth daily. 02/07/19  Yes Sainani, Belia Heman, MD  prazosin (MINIPRESS) 2 MG capsule Take 2 mg by mouth at bedtime.   Yes [provider]  sertraline (ZOLOFT) 100 MG tablet Take 100 mg by mouth daily.   Yes [provider]  allopurinol (ZYLOPRIM) 100 MG tablet Take 100 mg by mouth daily.    [provider]  sildenafil (VIAGRA) 100 MG tablet Take 100 mg by mouth daily as needed for erectile dysfunction.    [provider]    Allergies as of 10/29/2019 - Review Complete 10/29/2019  Allergen Reaction Noted  . Tape Anaphylaxis 08/29/2012  . Dye fdc red [red dye] Nausea And Vomiting 10/01/2013  . Hydrochlorothiazide w-triamterene Other (See Comments) 02/23/2010    Family History  Problem Relation Age of Onset  . Hypertension Mother     Social History   Socioeconomic History  . Marital status: Married    Spouse name: Not on file  . Number of children: Not on file  . Years of education: Not on file  . Highest education level: Not on file  Occupational History  . Not on file  Tobacco Use  . Smoking status: Current Every Day Smoker  . Smokeless tobacco: Never Used  Vaping Use  .  Vaping Use: Never used  Substance and Sexual Activity  . Alcohol use: Yes    Alcohol/week: 6.0 standard drinks    Types: 6 Cans of beer per week    Comment: DAILY  . Drug use: Yes    Types: Cocaine, Marijuana    Comment: used last week  . Sexual activity: Not on file  Other Topics Concern  . Not on file  Social History Narrative  . Not on file   Social Determinants of Health   Financial Resource Strain:   . Difficulty of Paying Living Expenses:   Food Insecurity:   . Worried About Charity fundraiser in the Last Year:   . Arboriculturist in the Last Year:   Transportation Needs:   . Film/video editor (Medical):   [name] Lack of Transportation (Non-Medical):   Physical Activity:    . Days of Exercise per Week:   . Minutes of Exercise per Session:   Stress:   . Feeling of Stress :   Social Connections:   . Frequency of Communication with Friends and Family:   . Frequency of Social Gatherings with Friends and Family:   . Attends Religious Services:   . Active Member of Clubs or Organizations:   . Attends Archivist Meetings:   [name] Marital Status:   Intimate Partner Violence:   . Fear of Current or Ex-Partner:   . Emotionally Abused:   [name] Physically Abused:   . Sexually Abused:     Review of Systems: See HPI, otherwise negative ROS  Physical Exam: BP (!) 137/91   Pulse 77   Temp (!) 97.3 F (36.3 C) (Tympanic)   Resp 20   Ht 5\' 6"  (1.676 m)   Wt 63.5 kg   SpO2 100%   BMI 22.60 kg/m  General:   Alert,  pleasant and cooperative in NAD Head:  Normocephalic and atraumatic. Neck:  Supple; no masses or thyromegaly. Lungs:  Clear throughout to auscultation.    Heart:  Regular rate and rhythm. Abdomen:  Soft, nontender and nondistended. Normal bowel sounds, without guarding, and without rebound.   Neurologic:  Alert and  oriented x4;  grossly normal neurologically.  Impression/Plan: Val Riles. is here for an endoscopy to be performed for epigastric pain  Risks, benefits, limitations, and alternatives regarding  endoscopy have been reviewed with the patient.  Questions have been answered.  All parties agreeable.   [name], MD  12/03/2019, 10:39 AM

## 2019-12-03 NOTE — OR Nursing (Addendum)
Patient tested positive for cocaine and was informed his case would be cancelled.  I asked him to wait to talk to Dr Marius Ditch but after about 15 min he decided to leave due to ride.  He asked to have Dr Marius Ditch call him  Patient was advised to stop using cocaine for at least 5 - 7 days prior to the procedure or to stop all together

## 2019-12-22 ENCOUNTER — Ambulatory Visit (INDEPENDENT_AMBULATORY_CARE_PROVIDER_SITE_OTHER): Payer: 59 | Admitting: Podiatry

## 2019-12-22 ENCOUNTER — Encounter: Payer: Self-pay | Admitting: Podiatry

## 2019-12-22 ENCOUNTER — Other Ambulatory Visit: Payer: Self-pay

## 2019-12-22 DIAGNOSIS — M2010 Hallux valgus (acquired), unspecified foot: Secondary | ICD-10-CM

## 2019-12-22 DIAGNOSIS — M79676 Pain in unspecified toe(s): Secondary | ICD-10-CM

## 2019-12-22 DIAGNOSIS — E1142 Type 2 diabetes mellitus with diabetic polyneuropathy: Secondary | ICD-10-CM | POA: Diagnosis not present

## 2019-12-22 DIAGNOSIS — B351 Tinea unguium: Secondary | ICD-10-CM | POA: Diagnosis not present

## 2019-12-22 DIAGNOSIS — L84 Corns and callosities: Secondary | ICD-10-CM

## 2019-12-22 MED ORDER — AMMONIUM LACTATE 12 % EX CREA: TOPICAL_CREAM | CUTANEOUS | 0 refills | Status: DC | PRN

## 2019-12-22 NOTE — Progress Notes (Addendum)
This patient returns to the office for evaluation and treatment of long thick painful nails .  This patient is unable to trim his own nails since the patient cannot reach the feet.  Patient says the nails are painful walking and wearing his shoes.  He also has callus heel  B/L and callus right big toe and under his right forefoot.  These calluses are painful walking and wearing his shoes. He returns for preventive foot care services.  General Appearance  Alert, conversant and in no acute stress.  Vascular  Dorsalis pedis and posterior tibial  pulses are palpable  bilaterally.  Capillary return is within normal limits  bilaterally. Temperature is within normal limits  bilaterally.  Neurologic  Senn-Weinstein monofilament wire test within normal limits  bilaterally. Muscle power within normal limits bilaterally.  Nails Thick disfigured discolored nails with subungual debris  from hallux to fifth toes bilaterally. No evidence of bacterial infection or drainage bilaterally.  Orthopedic  No limitations of motion  feet .  No crepitus or effusions noted.  No bony pathology or digital deformities noted.  Skin  normotropic skin with no porokeratosis noted bilaterally.  No signs of infections or ulcers noted.   Pinch callus right hallux.  Porokeratosis sub 5th right foot.  Callus/crust  Medial and lateral aspect heels  B/L. lateral aspect heel  B/L.  Onychomycosis  Pain in toes right foot  Pain in toes left foot  Debridement  of nails  1-5  B/L with a nail nipper.  Nails were then filed using a dremel tool with no incidents.    . Debride callus with # 15 blade and dremel tool..  Prescribe lac-hydrin for this patient.    RTC 10 weeks    Gardiner Barefoot DPM

## 2019-12-22 NOTE — Addendum Note (Signed)
Addended byDeidre Ala, Arien Benincasa L on: 12/22/2019 11:29 AM   Modules accepted: Orders

## 2020-01-13 ENCOUNTER — Ambulatory Visit: Payer: 59 | Admitting: Gastroenterology

## 2020-02-15 ENCOUNTER — Other Ambulatory Visit: Payer: Self-pay | Admitting: Gastroenterology

## 2020-02-25 ENCOUNTER — Encounter: Payer: Self-pay | Admitting: *Deleted

## 2020-02-25 ENCOUNTER — Other Ambulatory Visit: Payer: Self-pay

## 2020-02-25 ENCOUNTER — Emergency Department: Payer: 59

## 2020-02-25 DIAGNOSIS — M542 Cervicalgia: Secondary | ICD-10-CM | POA: Diagnosis present

## 2020-02-25 DIAGNOSIS — S0990XA Unspecified injury of head, initial encounter: Secondary | ICD-10-CM | POA: Diagnosis not present

## 2020-02-25 DIAGNOSIS — Z5321 Procedure and treatment not carried out due to patient leaving prior to being seen by health care provider: Secondary | ICD-10-CM | POA: Insufficient documentation

## 2020-02-25 DIAGNOSIS — M545 Low back pain, unspecified: Secondary | ICD-10-CM | POA: Insufficient documentation
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
## 2020-02-25 NOTE — ED Notes (Signed)
Pt has a brace on right knee.
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
## 2020-02-26 ENCOUNTER — Emergency Department
Admission: EM | Admit: 2020-02-26 | Discharge: 2020-02-26 | Disposition: A | Discharge status: Home / Self Care | Payer: 59 | Attending: Emergency Medicine | Admitting: Emergency Medicine

## 2020-03-01 ENCOUNTER — Encounter: Payer: Self-pay | Admitting: Podiatry

## 2020-03-01 ENCOUNTER — Other Ambulatory Visit: Payer: Self-pay

## 2020-03-01 ENCOUNTER — Ambulatory Visit (INDEPENDENT_AMBULATORY_CARE_PROVIDER_SITE_OTHER): Payer: 59 | Admitting: Podiatry

## 2020-03-01 DIAGNOSIS — E1142 Type 2 diabetes mellitus with diabetic polyneuropathy: Secondary | ICD-10-CM | POA: Diagnosis not present

## 2020-03-01 DIAGNOSIS — M2010 Hallux valgus (acquired), unspecified foot: Secondary | ICD-10-CM

## 2020-03-01 DIAGNOSIS — B351 Tinea unguium: Secondary | ICD-10-CM | POA: Diagnosis not present

## 2020-03-01 DIAGNOSIS — L84 Corns and callosities: Secondary | ICD-10-CM | POA: Diagnosis not present

## 2020-03-01 DIAGNOSIS — M79676 Pain in unspecified toe(s): Secondary | ICD-10-CM | POA: Diagnosis not present

## 2020-03-01 NOTE — Progress Notes (Signed)
This patient returns to the office for evaluation and treatment of long thick painful nails .  This patient is unable to trim his own nails since the patient cannot reach the feet.  Patient says the nails are painful walking and wearing his shoes.  He also has callus heel  B/L and callus right big toe and under his right forefoot.  These calluses are painful walking and wearing his shoes. He returns for preventive foot care services.  General Appearance  Alert, conversant and in no acute stress.  Vascular  Dorsalis pedis and posterior tibial  pulses are palpable  bilaterally.  Capillary return is within normal limits  bilaterally. Temperature is within normal limits  bilaterally.  Neurologic  Senn-Weinstein monofilament wire test within normal limits  bilaterally. Muscle power within normal limits bilaterally.  Nails Thick disfigured discolored nails with subungual debris  from hallux to fifth toes bilaterally. No evidence of bacterial infection or drainage bilaterally. Pain proximal nail fold left hallux.  Orthopedic  No limitations of motion  feet .  No crepitus or effusions noted.  No bony pathology or digital deformities noted.  Skin  normotropic skin with no porokeratosis noted bilaterally.  No signs of infections or ulcers noted.   Pinch callus right hallux.  Porokeratosis sub 5th right foot.  Callus/crust  Medial and lateral aspect heels  B/L. lateral aspect heel  B/L.  Onychomycosis  Pain in toes right foot  Pain in toes left foot  Debridement  of nails  1-5  B/L with a nail nipper.  Nails were then filed using a dremel tool with no incidents.    . Debride callus with # 15 blade and dremel tool..  Prescribe lac-hydrin for this patient.    RTC 9 weeks    Mairany Bruno DPM  

## 2020-03-25 ENCOUNTER — Ambulatory Visit: Payer: 59 | Admitting: Podiatry

## 2020-04-27 ENCOUNTER — Telehealth: Payer: Self-pay

## 2020-04-27 NOTE — Telephone Encounter (Signed)
Patient states he does not want to have the liver biopsy done

## 2020-05-10 ENCOUNTER — Ambulatory Visit: Payer: 59 | Admitting: Podiatry

## 2020-05-13 ENCOUNTER — Ambulatory Visit (INDEPENDENT_AMBULATORY_CARE_PROVIDER_SITE_OTHER): Payer: 59 | Admitting: Podiatry

## 2020-05-13 ENCOUNTER — Encounter: Payer: Self-pay | Admitting: *Deleted

## 2020-05-13 ENCOUNTER — Other Ambulatory Visit: Payer: Self-pay

## 2020-05-13 ENCOUNTER — Encounter: Payer: Self-pay | Admitting: Podiatry

## 2020-05-13 DIAGNOSIS — B351 Tinea unguium: Secondary | ICD-10-CM | POA: Diagnosis not present

## 2020-05-13 DIAGNOSIS — L84 Corns and callosities: Secondary | ICD-10-CM | POA: Diagnosis not present

## 2020-05-13 DIAGNOSIS — M79676 Pain in unspecified toe(s): Secondary | ICD-10-CM | POA: Diagnosis not present

## 2020-05-13 DIAGNOSIS — E1142 Type 2 diabetes mellitus with diabetic polyneuropathy: Secondary | ICD-10-CM

## 2020-05-13 NOTE — Progress Notes (Signed)
This patient returns to the office for evaluation and treatment of long thick painful nails .  This patient is unable to trim his own nails since the patient cannot reach the feet.  Patient says the nails are painful walking and wearing his shoes.  He also has callus heel  B/L and callus right big toe and under his right forefoot.  These calluses are painful walking and wearing his shoes. He returns for preventive foot care services.  General Appearance  Alert, conversant and in no acute stress.  Vascular  Dorsalis pedis and posterior tibial  pulses are palpable  bilaterally.  Capillary return is within normal limits  bilaterally. Temperature is within normal limits  bilaterally.  Neurologic  Senn-Weinstein monofilament wire test within normal limits  bilaterally. Muscle power within normal limits bilaterally.  Nails Thick disfigured discolored nails with subungual debris  from hallux to fifth toes bilaterally. No evidence of bacterial infection or drainage bilaterally. Pain proximal nail fold left hallux.  Orthopedic  No limitations of motion  feet .  No crepitus or effusions noted.  No bony pathology or digital deformities noted.  Skin  normotropic skin with no porokeratosis noted bilaterally.  No signs of infections or ulcers noted.   Pinch callus right hallux.  Porokeratosis sub 5th right foot.  Callus/crust  Medial and lateral aspect heels  B/L. lateral aspect heel  B/L.  Onychomycosis  Pain in toes right foot  Pain in toes left foot  Debridement  of nails  1-5  B/L with a nail nipper.  Nails were then filed using a dremel tool with no incidents.    . Debride callus with # 15 blade and dremel tool..  Prescribe lac-hydrin for this patient.    RTC 9 weeks    Dennis Rowland DPM  

## 2020-07-15 ENCOUNTER — Ambulatory Visit (INDEPENDENT_AMBULATORY_CARE_PROVIDER_SITE_OTHER): Payer: 59 | Admitting: Podiatry

## 2020-07-15 ENCOUNTER — Encounter: Payer: Self-pay | Admitting: Podiatry

## 2020-07-15 ENCOUNTER — Other Ambulatory Visit: Payer: Self-pay

## 2020-07-15 DIAGNOSIS — M2010 Hallux valgus (acquired), unspecified foot: Secondary | ICD-10-CM

## 2020-07-15 DIAGNOSIS — B351 Tinea unguium: Secondary | ICD-10-CM

## 2020-07-15 DIAGNOSIS — M205X1 Other deformities of toe(s) (acquired), right foot: Secondary | ICD-10-CM

## 2020-07-15 DIAGNOSIS — M79676 Pain in unspecified toe(s): Secondary | ICD-10-CM

## 2020-07-15 DIAGNOSIS — E1142 Type 2 diabetes mellitus with diabetic polyneuropathy: Secondary | ICD-10-CM

## 2020-07-15 DIAGNOSIS — Q828 Other specified congenital malformations of skin: Secondary | ICD-10-CM

## 2020-07-15 DIAGNOSIS — L84 Corns and callosities: Secondary | ICD-10-CM

## 2020-07-15 NOTE — Progress Notes (Signed)
This patient returns to the office for evaluation and treatment of long thick painful nails .  This patient is unable to trim his own nails since the patient cannot reach the feet.  Patient says the nails are painful walking and wearing his shoes.  He also has callus heel  B/L and callus right big toe and under his right forefoot.  These calluses are painful walking and wearing his shoes. He returns for preventive foot care services.  General Appearance  Alert, conversant and in no acute stress.  Vascular  Dorsalis pedis and posterior tibial  pulses are palpable  bilaterally.  Capillary return is within normal limits  bilaterally. Temperature is within normal limits  bilaterally.  Neurologic  Senn-Weinstein monofilament wire test within normal limits  bilaterally. Muscle power within normal limits bilaterally.  Nails Thick disfigured discolored nails with subungual debris  from hallux to fifth toes bilaterally. No evidence of bacterial infection or drainage bilaterally. Pain proximal nail fold left hallux.  Orthopedic  No limitations of motion  feet .  No crepitus or effusions noted.  No bony pathology or digital deformities noted.  Skin  normotropic skin with no porokeratosis noted bilaterally.  No signs of infections or ulcers noted.   Pinch callus right hallux.  Porokeratosis sub 5th right foot.  Callus/crust  Medial and lateral aspect heels  B/L. lateral aspect heel  B/L.  Onychomycosis  Pain in toes right foot  Pain in toes left foot  Debridement  of nails  1-5  B/L with a nail nipper.  Nails were then filed using a dremel tool with no incidents.    . Debride callus with # 15 blade and dremel tool..  Prescribe lac-hydrin for this patient.    RTC 9 weeks    Gardiner Barefoot DPM

## 2020-09-16 ENCOUNTER — Encounter: Payer: 59 | Admitting: Podiatry

## 2020-09-16 ENCOUNTER — Encounter: Payer: Self-pay | Admitting: Physician Assistant

## 2020-09-21 NOTE — Progress Notes (Signed)
This encounter was created in error - please disregard.

## 2020-10-15 ENCOUNTER — Other Ambulatory Visit: Payer: Self-pay

## 2020-10-15 MED ORDER — SUPREP BOWEL PREP KIT 17.5-3.13-1.6 GM/177ML PO SOLN
1.0000 | ORAL | 0 refills | Status: DC
Start: 2020-10-15 — End: 2020-11-01

## 2020-10-18 ENCOUNTER — Encounter: Payer: Self-pay | Admitting: Podiatry

## 2020-10-18 ENCOUNTER — Other Ambulatory Visit: Payer: Self-pay

## 2020-10-18 ENCOUNTER — Ambulatory Visit (INDEPENDENT_AMBULATORY_CARE_PROVIDER_SITE_OTHER): Payer: 59 | Admitting: Podiatry

## 2020-10-18 DIAGNOSIS — L84 Corns and callosities: Secondary | ICD-10-CM | POA: Diagnosis not present

## 2020-10-18 DIAGNOSIS — M722 Plantar fascial fibromatosis: Secondary | ICD-10-CM

## 2020-10-18 DIAGNOSIS — M79676 Pain in unspecified toe(s): Secondary | ICD-10-CM | POA: Diagnosis not present

## 2020-10-18 DIAGNOSIS — B351 Tinea unguium: Secondary | ICD-10-CM

## 2020-10-18 DIAGNOSIS — E1142 Type 2 diabetes mellitus with diabetic polyneuropathy: Secondary | ICD-10-CM | POA: Diagnosis not present

## 2020-10-18 MED ORDER — MELOXICAM 7.5 MG PO TABS: 7.5000 mg | ORAL_TABLET | Freq: Every day | ORAL | 1 refills | Status: DC

## 2020-10-18 NOTE — Progress Notes (Signed)
This patient returns to the office for evaluation and treatment of long thick painful nails .  This patient is unable to trim his own nails since the patient cannot reach the feet.  Patient says the nails are painful walking and wearing his shoes.  He also has callus heel  B/L . These calluses are painful walking and wearing his shoes. Patient has pain in right heel for 2 days. Denies trauma.  No self treatment was afforded. He returns for preventive foot care services.  General Appearance  Alert, conversant and in no acute stress.  Vascular  Dorsalis pedis and posterior tibial  pulses are palpable  bilaterally.  Capillary return is within normal limits  bilaterally. Temperature is within normal limits  bilaterally.  Neurologic  Senn-Weinstein monofilament wire test within normal limits  bilaterally. Muscle power within normal limits bilaterally.  Nails Thick disfigured discolored nails with subungual debris  from hallux to fifth toes bilaterally. No evidence of bacterial infection or drainage bilaterally. Pain proximal nail fold left hallux.  Orthopedic  No limitations of motion  feet .  No crepitus or effusions noted.  No bony pathology or digital deformities noted. Palpable pain at the insertion plantar fascia right foot.  Skin  normotropic skin with no porokeratosis noted bilaterally.  No signs of infections or ulcers noted.   Pinch callus right hallux.  Porokeratosis sub 5th right foot.  Callus/crust  Medial and lateral aspect heels  B/L. lateral aspect heel  B/L.  Onychomycosis  Pain in toes right foot  Pain in toes left foot  Debridement  of nails  1-5  B/L with a nail nipper.  Nails were then filed using a dremel tool with no incidents.    . Debride callus with # 15 blade and dremel tool.. Discussed heel pain with this patient.  He says his pain is lessening.  Therefore I will prescribe  Mobic for this patient.  If pain persists he needs to make an appointment with medical podiatrist for  X-rays and treatment.   RTC 9 weeks    Gardiner Barefoot DPM

## 2020-10-18 NOTE — Addendum Note (Signed)
Addended by: Graceann Congress D on: 10/18/2020 01:38 PM   Modules accepted: Orders

## 2020-10-25 ENCOUNTER — Telehealth: Payer: Self-pay

## 2020-10-25 NOTE — Telephone Encounter (Signed)
Patient called to find out what he needs to take for his prep for colonoscopy tomorrow. He states he has the instructions at home but no prep. Informed patient that prep was sent to the [facility] and he states he will go and pick medication up. Asked patient if he has had clear liquids today and he states he ate breakfast at 6am but that is it. Informed patient he has to have clear liquids all day and he can not eat breakfast. Patient began to yell at me and states he already has a ride arrange for tomorrow and is not changing his procedure. Informed patient If he is not clean out they will not be able to do colonoscopy and he states he will be clean out and states he will arrive to the endo unite tomorrow morning at 7:45

## 2020-10-26 ENCOUNTER — Other Ambulatory Visit: Payer: Self-pay

## 2020-10-26 ENCOUNTER — Ambulatory Visit: Payer: 59 | Admitting: Anesthesiology

## 2020-10-26 ENCOUNTER — Encounter: Payer: Self-pay | Admitting: Gastroenterology

## 2020-10-26 ENCOUNTER — Encounter
Admission: RE | Disposition: A | Discharge status: Home / Self Care | Payer: Self-pay | Source: Home / Self Care | Attending: Gastroenterology

## 2020-10-26 ENCOUNTER — Ambulatory Visit
Admission: RE | Admit: 2020-10-26 | Discharge: 2020-10-26 | Disposition: A | Discharge status: Home / Self Care | Payer: 59 | Attending: Gastroenterology | Admitting: Gastroenterology

## 2020-10-26 DIAGNOSIS — Z8249 Family history of ischemic heart disease and other diseases of the circulatory system: Secondary | ICD-10-CM | POA: Insufficient documentation

## 2020-10-26 DIAGNOSIS — Z7984 Long term (current) use of oral hypoglycemic drugs: Secondary | ICD-10-CM | POA: Insufficient documentation

## 2020-10-26 DIAGNOSIS — K635 Polyp of colon: Secondary | ICD-10-CM

## 2020-10-26 DIAGNOSIS — Z1211 Encounter for screening for malignant neoplasm of colon: Secondary | ICD-10-CM

## 2020-10-26 DIAGNOSIS — D124 Benign neoplasm of descending colon: Secondary | ICD-10-CM | POA: Diagnosis not present

## 2020-10-26 DIAGNOSIS — D123 Benign neoplasm of transverse colon: Secondary | ICD-10-CM | POA: Diagnosis not present

## 2020-10-26 DIAGNOSIS — Z79899 Other long term (current) drug therapy: Secondary | ICD-10-CM | POA: Diagnosis not present

## 2020-10-26 DIAGNOSIS — Z794 Long term (current) use of insulin: Secondary | ICD-10-CM | POA: Diagnosis not present

## 2020-10-26 DIAGNOSIS — Z888 Allergy status to other drugs, medicaments and biological substances status: Secondary | ICD-10-CM | POA: Diagnosis not present

## 2020-10-26 DIAGNOSIS — Z7982 Long term (current) use of aspirin: Secondary | ICD-10-CM | POA: Diagnosis not present

## 2020-10-26 DIAGNOSIS — F17200 Nicotine dependence, unspecified, uncomplicated: Secondary | ICD-10-CM | POA: Diagnosis not present

## 2020-10-26 HISTORY — PX: COLONOSCOPY: SHX5424

## 2020-10-26 LAB — URINE DRUG SCREEN, QUALITATIVE (ARMC ONLY)
Amphetamines, Ur Screen: NOT DETECTED
Barbiturates, Ur Screen: NOT DETECTED
Benzodiazepine, Ur Scrn: NOT DETECTED
Cannabinoid 50 Ng, Ur ~~LOC~~: NOT DETECTED
Cocaine Metabolite,Ur ~~LOC~~: NOT DETECTED
MDMA (Ecstasy)Ur Screen: NOT DETECTED
Methadone Scn, Ur: NOT DETECTED
Opiate, Ur Screen: NOT DETECTED
Phencyclidine (PCP) Ur S: NOT DETECTED
Tricyclic, Ur Screen: NOT DETECTED

## 2020-10-26 LAB — GLUCOSE, CAPILLARY: Glucose-Capillary: 293 mg/dL — ABNORMAL HIGH (ref 70–99)

## 2020-10-26 SURGERY — COLONOSCOPY
Anesthesia: General

## 2020-10-26 MED ORDER — PROPOFOL 500 MG/50ML IV EMUL
INTRAVENOUS | Status: DC | PRN
  Administered 2020-10-26: 130 ug/kg/min via INTRAVENOUS

## 2020-10-26 MED ORDER — DEXMEDETOMIDINE (PRECEDEX) IN NS 20 MCG/5ML (4 MCG/ML) IV SYRINGE
PREFILLED_SYRINGE | INTRAVENOUS | Status: DC | PRN
  Administered 2020-10-26: 12 ug via INTRAVENOUS

## 2020-10-26 MED ORDER — SODIUM CHLORIDE 0.9 % IV SOLN: INTRAVENOUS | Status: DC

## 2020-10-26 MED ORDER — PROPOFOL 10 MG/ML IV BOLUS
INTRAVENOUS | Status: DC | PRN
  Administered 2020-10-26: 100 mg via INTRAVENOUS

## 2020-10-26 MED ORDER — LIDOCAINE HCL (CARDIAC) PF 100 MG/5ML IV SOSY
PREFILLED_SYRINGE | INTRAVENOUS | Status: DC | PRN
  Administered 2020-10-26: 50 mg via INTRAVENOUS

## 2020-10-26 MED ORDER — PANCRELIPASE (LIP-PROT-AMYL) 36000-114000 UNITS PO CPEP: ORAL_CAPSULE | ORAL | 3 refills | Status: DC

## 2020-10-26 NOTE — Op Note (Signed)
[facility] Gastroenterology Patient Name: Dennis Rowland Procedure Date: 10/26/2020 8:35 AM MRN: [id number] Account #: [id number] Date of Birth: 1961/01/20 Admit Type: Outpatient Age: 60 Room: [facility] ENDO ROOM 3 Gender: Male Note Status: Finalized Procedure:             Colonoscopy Indications:           Screening for colorectal malignant neoplasm, This is                         the patient's first colonoscopy Providers:             Lin Landsman MD, MD Referring MD:          No Local Md, MD (Referring MD) Medicines:             General Anesthesia Complications:         No immediate complications. Estimated blood loss: None. Procedure:             Pre-Anesthesia Assessment:                        - Prior to the procedure, a History and Physical was                         performed, and patient medications and allergies were                         reviewed. The patient is competent. The risks and                         benefits of the procedure and the sedation options and                         risks were discussed with the patient. All questions                         were answered and informed consent was obtained.                         Patient identification and proposed procedure were                         verified by the physician, the nurse, the                         anesthesiologist, the anesthetist and the technician                         in the pre-procedure area in the procedure room in the                         endoscopy suite. Mental Status Examination: alert and                         oriented. Airway Examination: normal oropharyngeal                         airway and neck mobility. Respiratory Examination:  clear to auscultation. CV Examination: normal.                         Prophylactic Antibiotics: The patient does not require                         prophylactic antibiotics. Prior Anticoagulants: The                          patient has taken no previous anticoagulant or                         antiplatelet agents. ASA Grade Assessment: II - A                         patient with mild systemic disease. After reviewing                         the risks and benefits, the patient was deemed in                         satisfactory condition to undergo the procedure. The                         anesthesia plan was to use general anesthesia.                         Immediately prior to administration of medications,                         the patient was re-assessed for adequacy to receive                         sedatives. The heart rate, respiratory rate, oxygen                         saturations, blood pressure, adequacy of pulmonary                         ventilation, and response to care were monitored                         throughout the procedure. The physical status of the                         patient was re-assessed after the procedure.                        After obtaining informed consent, the colonoscope was                         passed under direct vision. Throughout the procedure,                         the patient's blood pressure, pulse, and oxygen                         saturations were monitored continuously. The  Colonoscope was introduced through the anus and                         advanced to the the cecum, identified by appendiceal                         orifice and ileocecal valve. The colonoscopy was                         performed without difficulty. The patient tolerated                         the procedure well. The quality of the bowel                         preparation was evaluated using the BBPS [facility] Bowel                         Preparation Scale) with scores of: Right Colon = 3,                         Transverse Colon = 3 and Left Colon = 3 (entire mucosa                         seen well with no residual staining,  small fragments                         of stool or opaque liquid). The total BBPS score                         equals 9. Findings:      The perianal and digital rectal examinations were normal. Pertinent       negatives include normal sphincter tone and no palpable rectal lesions.      A diminutive polyp was found in the transverse colon. The polyp was       sessile. The polyp was removed with a cold biopsy forceps. Resection and       retrieval were complete.      Two sessile polyps were found in the descending colon. The polyps were 3       to 5 mm in size. These polyps were removed with a cold snare. Resection       and retrieval were complete.      A 4 mm polyp was found in the rectum. The polyp was sessile. The polyp       was removed with a cold snare. Resection and retrieval were complete.      The retroflexed view of the distal rectum and anal verge was normal and       showed no anal or rectal abnormalities.      Normal mucosa was found in the entire colon. Biopsies were taken with a       cold forceps for histology. Impression:            - One diminutive polyp in the transverse colon,                         removed with a cold biopsy forceps. Resected and  retrieved.                        - Two 3 to 5 mm polyps in the descending colon,                         removed with a cold snare. Resected and retrieved.                        - One 4 mm polyp in the rectum, removed with a cold                         snare. Resected and retrieved.                        - The distal rectum and anal verge are normal on                         retroflexion view.                        - Normal mucosa in the entire examined colon. Biopsied. Recommendation:        - Discharge patient to home (with escort).                        - Resume previous diet today.                        - Continue present medications.                        - Await pathology results.                         - Repeat colonoscopy in 7 years for surveillance.                        - Return to my office at appointment to be scheduled. Procedure Code(s):     --- Professional ---                        [phone], Colonoscopy, flexible; with removal of                         tumor(s), polyp(s), or other lesion(s) by snare                         technique                        79024, 7, Colonoscopy, flexible; with biopsy, single                         or multiple Diagnosis Code(s):     --- Professional ---                        Z12.11, Encounter for screening for malignant neoplasm                         of colon  K63.5, Polyp of colon                        K62.1, Rectal polyp CPT copyright 2019 American Medical Association. All rights reserved. The codes documented in this report are preliminary and upon coder review may  be revised to meet current compliance requirements. Dr. Ulyess Mort Lin Landsman MD, MD 10/26/2020 9:18:45 AM This report has been signed electronically. Number of Addenda: 0 Note Initiated On: 10/26/2020 8:35 AM Scope Withdrawal Time: 0 hours 16 minutes 56 seconds  Total Procedure Duration: 0 hours 19 minutes 56 seconds  Estimated Blood Loss:  Estimated blood loss: none.      [facility]

## 2020-10-26 NOTE — Transfer of Care (Signed)
Immediate Anesthesia Transfer of Care Note  Patient: Dennis Rowland.  Procedure(s) Performed: COLONOSCOPY  Patient Location: PACU and Endoscopy Unit  Anesthesia Type:General  Level of Consciousness: drowsy and patient cooperative  Airway & Oxygen Therapy: Patient Spontanous Breathing  Post-op Assessment: Report given to RN and Post -op Vital signs reviewed and stable  Post vital signs: Reviewed and stable  Last Vitals:  Vitals Value Taken Time  BP 126/77 10/26/20 0922  Temp 36.4 C 10/26/20 0921  Pulse 72 10/26/20 0924  Resp 17 10/26/20 0924  SpO2 98 % 10/26/20 0924  Vitals shown include unvalidated device data.  Last Pain:  Vitals:   10/26/20 0921  TempSrc: Temporal  PainSc: Asleep         Complications: No notable events documented.

## 2020-10-26 NOTE — Anesthesia Postprocedure Evaluation (Signed)
Anesthesia Post Note  Patient: Dennis Rowland.  Procedure(s) Performed: COLONOSCOPY  Patient location during evaluation: Endoscopy Anesthesia Type: General Level of consciousness: awake and alert Pain management: pain level controlled Vital Signs Assessment: post-procedure vital signs reviewed and stable Respiratory status: spontaneous breathing, nonlabored ventilation, respiratory function stable and patient connected to nasal cannula oxygen Cardiovascular status: blood pressure returned to baseline and stable Postop Assessment: no apparent nausea or vomiting Anesthetic complications: no   No notable events documented.   Last Vitals:  Vitals:   10/26/20 0931 10/26/20 0941  BP: 133/85 (!) 151/90  Pulse: 72 65  Resp: 17 14  Temp:    SpO2: 98% 98%    Last Pain:  Vitals:   10/26/20 0941  TempSrc:   PainSc: 0-No pain                 Arita Miss

## 2020-10-26 NOTE — H&P (Signed)
Dennis Darby, MD [address]  [place]  [place], [place] [address]  Main: [phone]  Fax: [phone] Pager: [phone]  Primary Care Physician:  Cassandra Primary Gastroenterologist:  Dr. Cephas Rowland  Pre-Procedure History & Physical: HPI:  Dennis Rowland. is a 60 y.o. male is here for an colonoscopy.   Past Medical History:  Diagnosis Date   Arthritis    Chronic sniffling    Diabetes ([place])    Dyspnea    some doe   GERD (gastroesophageal reflux disease)    Gout    Hyperlipidemia    Hypertension    Neuropathy     Past Surgical History:  Procedure Laterality Date   COLONOSCOPY WITH PROPOFOL     NO PAST SURGERIES      Prior to Admission medications   Medication Sig Start Date End Date Taking? Authorizing Provider  allopurinol (ZYLOPRIM) 100 MG tablet Take 100 mg by mouth daily.   Yes [provider]  aspirin EC 81 MG tablet Take 1 tablet (81 mg total) by mouth daily. 02/07/19  Yes Sainani, Belia Heman, MD  ammonium lactate (LAC-HYDRIN) 12 % cream Apply topically as needed for dry skin. 12/22/19   Gardiner Barefoot, DPM  cetirizine (ZYRTEC) 10 MG tablet Take 1 tablet (10 mg total) by mouth daily. 02/07/19   Henreitta Leber, MD  colchicine 0.6 MG tablet Take 0.6 mg by mouth daily.     [provider]  CREON 36000-114000 units CPEP capsule TAKE 2 CAPSULE WITH THE FIRST BITE OF [facility] MEAL AND 1 CAPSULE WITH THE FIRST BITE OF [facility] 02/16/20   Fleta Borgeson, Tally Due, MD  gabapentin (NEURONTIN) 300 MG capsule Take 300 mg by mouth 3 (three) times daily.    [provider]  insulin detemir (LEVEMIR) 100 UNIT/ML FlexPen Inject 40 Units into the skin at bedtime.     [provider]  lisinopril (PRINIVIL,ZESTRIL) 5 MG tablet Take 5 mg by mouth daily.  03/02/15   [provider]  meloxicam (MOBIC) 7.5 MG tablet Take 1 tablet (7.5 mg total) by mouth daily. 10/18/20   Gardiner Barefoot, DPM  metFORMIN (GLUCOPHAGE) 850  MG tablet Take 850 mg by mouth 2 (two) times daily with a meal.     [provider]  Na Sulfate-K Sulfate-Mg Sulf (SUPREP BOWEL PREP KIT) 17.5-3.13-1.6 GM/177ML SOLN Take 1 kit by mouth as directed. 10/15/20   Lin Landsman, MD  omeprazole (PRILOSEC) 20 MG capsule Take 20 mg by mouth daily. 06/04/19   [provider]  pantoprazole (PROTONIX) 40 MG tablet Take 1 tablet (40 mg total) by mouth daily. 02/07/19   Henreitta Leber, MD  prazosin (MINIPRESS) 2 MG capsule Take 2 mg by mouth at bedtime.    [provider]  sertraline (ZOLOFT) 100 MG tablet Take 100 mg by mouth daily.    [provider]  sildenafil (VIAGRA) 100 MG tablet Take 100 mg by mouth daily as needed for erectile dysfunction.    [provider]    Allergies as of 10/14/2020 - Review Complete 07/15/2020  Allergen Reaction Noted   Tape Anaphylaxis 08/29/2012   Hydrochlorothiazide w-triamterene Other (See Comments) 02/23/2010   Red dye Nausea And Vomiting 10/01/2013    Family History  Problem Relation Age of Onset   Hypertension Mother     Social History   Socioeconomic History   Marital status: Married    Spouse name: Not on file   Number of  children: Not on file   Years of education: Not on file   Highest education level: Not on file  Occupational History   Not on file  Tobacco Use   Smoking status: Every Day    Pack years: 0.00   Smokeless tobacco: Never  Vaping Use   Vaping Use: Never used  Substance and Sexual Activity   Alcohol use: Yes    Alcohol/week: 6.0 standard drinks    Types: 6 Cans of beer per week    Comment: DAILY   Drug use: Yes    Types: Cocaine, Marijuana    Comment: last thursday   Sexual activity: Not on file  Other Topics Concern   Not on file  Social History Narrative   Not on file   Social Determinants of Health   Financial Resource Strain: Not on file  Food Insecurity: Not on file  Transportation Needs: Not on file  Physical  Activity: Not on file  Stress: Not on file  Social Connections: Not on file  Intimate Partner Violence: Not on file    Review of Systems: See HPI, otherwise negative ROS  Physical Exam: BP (!) 131/91   Pulse 77   Temp (!) 97.4 F (36.3 C) (Temporal)   Ht 5' 6"  (1.676 m)   SpO2 98%   BMI 22.60 kg/m  General:   Alert,  pleasant and cooperative in NAD Head:  Normocephalic and atraumatic. Neck:  Supple; no masses or thyromegaly. Lungs:  Clear throughout to auscultation.    Heart:  Regular rate and rhythm. Abdomen:  Soft, nontender and nondistended. Normal bowel sounds, without guarding, and without rebound.   Neurologic:  Alert and  oriented x4;  grossly normal neurologically.  Impression/Plan: Dennis Rowland. is here for an colonoscopy to be performed for colon cancer screening  Risks, benefits, limitations, and alternatives regarding  colonoscopy have been reviewed with the patient.  Questions have been answered.  All parties agreeable.   [name], MD  10/26/2020, 8:48 AM

## 2020-10-26 NOTE — Anesthesia Preprocedure Evaluation (Signed)
Anesthesia Evaluation  Patient identified by MRN, date of birth, ID band Patient awake    Reviewed: Allergy & Precautions, NPO status , Patient's Chart, lab work & pertinent test results  History of Anesthesia Complications Negative for: history of anesthetic complications  Airway Mallampati: II  TM Distance: >3 FB Neck ROM: Full    Dental  (+) Edentulous Upper, Edentulous Lower   Pulmonary neg sleep apnea, neg COPD, Current Smoker and Patient abstained from smoking.,    Pulmonary exam normal breath sounds clear to auscultation       Cardiovascular Exercise Tolerance: Good METShypertension, (-) CAD and (-) Past MI (-) dysrhythmias  Rhythm:Regular Rate:Normal - Systolic murmurs    Neuro/Psych negative neurological ROS  negative psych ROS   GI/Hepatic GERD  ,(+)     substance abuse  alcohol use and cocaine use, Has not used cocaine in a week   Endo/Other  diabetes  Renal/GU negative Renal ROS     Musculoskeletal   Abdominal   Peds  Hematology   Anesthesia Other Findings Past Medical History: No date: Arthritis No date: Chronic sniffling No date: Diabetes ([facility]) No date: Dyspnea     Comment:  some doe No date: GERD (gastroesophageal reflux disease) No date: Gout No date: Hyperlipidemia No date: Hypertension No date: Neuropathy  Reproductive/Obstetrics                             Anesthesia Physical Anesthesia Plan  ASA: 2  Anesthesia Plan: General   Post-op Pain Management:    Induction: Intravenous  PONV Risk Score and Plan: 1 and Ondansetron, Propofol infusion and TIVA  Airway Management Planned: Nasal Cannula  Additional Equipment: None  Intra-op Plan:   Post-operative Plan:   Informed Consent: I have reviewed the patients History and Physical, chart, labs and discussed the procedure including the risks, benefits and alternatives for the proposed anesthesia with  the patient or authorized representative who has indicated his/her understanding and acceptance.     Dental advisory given  Plan Discussed with: CRNA and Surgeon  Anesthesia Plan Comments: (Discussed risks of anesthesia with patient, including possibility of difficulty with spontaneous ventilation under anesthesia necessitating airway intervention, PONV, and rare risks such as cardiac or respiratory or neurological events. Patient understands.  Utox negative.)        Anesthesia Quick Evaluation

## 2020-10-27 ENCOUNTER — Encounter: Payer: Self-pay | Admitting: Gastroenterology

## 2020-10-27 LAB — SURGICAL PATHOLOGY

## 2020-10-29 ENCOUNTER — Encounter: Payer: Self-pay | Admitting: Gastroenterology

## 2020-11-01 ENCOUNTER — Encounter: Payer: Self-pay | Admitting: Gastroenterology

## 2020-11-01 ENCOUNTER — Other Ambulatory Visit: Payer: Self-pay

## 2020-11-01 ENCOUNTER — Ambulatory Visit (INDEPENDENT_AMBULATORY_CARE_PROVIDER_SITE_OTHER): Payer: 59 | Admitting: Gastroenterology

## 2020-11-01 VITALS — BP 132/86 | HR 78 | Temp 98.4°F | Ht 68.0 in | Wt 143.2 lb

## 2020-11-01 DIAGNOSIS — K8681 Exocrine pancreatic insufficiency: Secondary | ICD-10-CM

## 2020-11-01 NOTE — Progress Notes (Signed)
Dennis Darby, MD [address]  [place]  [place], [place] [address]  Main: [phone]  Fax: [phone]    Gastroenterology Consultation  Referring Provider:     [place] Physician:  [place] Primary Gastroenterologist:  Dr. Cephas Rowland Reason for Consultation:   Elevated LFTs, chronic liver disease        HPI:   Dennis Rowland. is a 60 y.o. male referred by Dr. [facility]  for consultation & management of elevated LFTs.  Patient was recently admitted to [facility] secondary to acute hepatitis, significantly elevated transaminases, AST 2910, ALT 3930, total bilirubin 2.2, alkaline phosphatase 70.  Patient has history of cocaine abuse, alcohol use.  He acknowledges drinking alcohol, he had history of chronic hep C that was treated and confirmed eradication.  During the hospital stay, his transaminases started trending down in 24 hours.  Acute viral hepatitis work-up came back negative for HSV, CMV, VZV, hepatitis A, B.  HIV negative, HCV antibody positive, RNA undetected, serum alcohol, acetaminophen, salicylate levels were negative.  His urine toxicology screen came back negative which was performed 2 days after admission.  Patient also has new onset of thrombocytopenia during this admission.  Has mild normocytic anemia.  He has mildly elevated smooth muscle antibody, ceruloplasmin levels were mildly low, ferritin is very elevated.  Ultrasound liver with Doppler was unremarkable, CT abdomen and pelvis revealed diffuse hepatic steatosis.  His serum AFP levels are significantly elevated  Interval summary Since discharge, patient continues to do well.  He denies drinking alcohol.  He does not have any complaints today  Follow-up visit 10/29/2019 He reports epigastric and left upper quadrant pain for more than 6 months radiating to back, associated with bloating, unintentional weight loss, increased bowel  frequency.  He denies nausea, vomiting, rectal bleeding, melena. He reports that he has been drinking lot of milk to alleviate pain.  He is also on omeprazole 20 mg and Protonix 40 mg, takes every other day.  He also reports tingling and numbness, or weakness in his upper extremities.  He reports his appetite is good.  Upper endoscopy was canceled last time because he was cocaine positive on urine tox screen.  Patient did not undergo liver biopsy as recommended.  Follow-up visit 11/01/2020 Patient is here for follow-up of chronic diarrhea which is secondary to exocrine pancreatic insufficiency.  When I saw him for colonoscopy this month, he was not on Creon and he reported severe diarrhea.  Colonoscopy with random colon biopsies was negative for microscopic colitis.  I refill his Creon which he started taking 2 pills with each meal and 1 pill with snack.  He reports that his diarrhea has significantly improved, currently going 3-4 times daily.  He also regained few pounds.  He continues to smoke daily.  He does drink 1-2 beers daily.  His last LFTs from 2021 were normal.  His most recent hemoglobin A1c is 9.9, maintained on insulin and metformin  NSAIDs: None  Antiplts/Anticoagulants/Anti thrombotics: None  GI Procedures: Reports having had a colonoscopy in 2017 January when he was incarcerated.  He was told that his colonoscopy was completely normal.  Colonoscopy 10/26/2020 - One diminutive polyp in the transverse colon, removed with a cold biopsy forceps. Resected and retrieved. - Two 3 to 5 mm polyps in the descending colon, removed with a cold snare. Resected and retrieved. - One 4 mm polyp in the rectum, removed with  a cold snare. Resected and retrieved. - The distal rectum and anal verge are normal on retroflexion view. - Normal mucosa in the entire examined colon. Biopsied.  DIAGNOSIS:  A. COLON POLYP, TRANSVERSE; COLD BIOPSY:  - TUBULAR ADENOMA.  - NEGATIVE FOR HIGH-GRADE DYSPLASIA AND  MALIGNANCY.   B. COLON, RANDOM; COLD BIOPSY:  - COLONIC MUCOSA WITH NO SIGNIFICANT PATHOLOGIC ALTERATION.  - NEGATIVE FOR ACTIVE INFLAMMATION AND FEATURES OF CHRONICITY.  - NEGATIVE FOR MICROSCOPIC COLITIS, DYSPLASIA, AND MALIGNANCY.   C. COLON POLYP, DESCENDING; COLD SNARE:  - TUBULAR ADENOMA.  - NEGATIVE FOR HIGH-GRADE DYSPLASIA AND MALIGNANCY.   D. RECTUM POLYP; COLD SNARE:  - HYPERPLASTIC POLYP.  - NEGATIVE FOR DYSPLASIA AND MALIGNANCY.   Past Medical History:  Diagnosis Date   Arthritis    Chronic sniffling    Diabetes ([place])    Dyspnea    some doe   GERD (gastroesophageal reflux disease)    Gout    Hyperlipidemia    Hypertension    Neuropathy     Past Surgical History:  Procedure Laterality Date   COLONOSCOPY N/A 10/26/2020   Procedure: COLONOSCOPY;  Surgeon: Lin Landsman, MD;  Location: [facility] ENDOSCOPY;  Service: Gastroenterology;  Laterality: N/A;   COLONOSCOPY WITH PROPOFOL     NO PAST SURGERIES     Current Outpatient Medications:    allopurinol (ZYLOPRIM) 100 MG tablet, Take 100 mg by mouth daily., Disp: , Rfl:    ammonium lactate (LAC-HYDRIN) 12 % cream, Apply topically as needed for dry skin., Disp: 385 g, Rfl: 0   aspirin EC 81 MG tablet, Take 1 tablet (81 mg total) by mouth daily., Disp:  , Rfl:    cetirizine (ZYRTEC) 10 MG tablet, Take 1 tablet (10 mg total) by mouth daily., Disp:  , Rfl:    colchicine 0.6 MG tablet, Take 0.6 mg by mouth daily. , Disp: , Rfl:    gabapentin (NEURONTIN) 300 MG capsule, Take 300 mg by mouth 3 (three) times daily., Disp: , Rfl:    insulin detemir (LEVEMIR) 100 UNIT/ML FlexPen, Inject 40 Units into the skin at bedtime. , Disp: , Rfl:    lipase/protease/amylase (CREON) 36000 UNITS CPEP capsule, TAKE 2 CAPSULE WITH THE FIRST BITE OF EACH MEAL AND 1 CAPSULE WITH THE FIRST BITE OF SNACK, Disp: 270 capsule, Rfl: 3   lisinopril (PRINIVIL,ZESTRIL) 5 MG tablet, Take 5 mg by mouth daily. , Disp: , Rfl:    meloxicam (MOBIC) 7.5 MG  tablet, Take 1 tablet (7.5 mg total) by mouth daily., Disp: 30 tablet, Rfl: 1   metFORMIN (GLUCOPHAGE) 850 MG tablet, Take 850 mg by mouth 2 (two) times daily with a meal. , Disp: , Rfl:    omeprazole (PRILOSEC) 20 MG capsule, Take 20 mg by mouth daily., Disp: , Rfl:    pantoprazole (PROTONIX) 40 MG tablet, Take 1 tablet (40 mg total) by mouth daily., Disp:  , Rfl:    prazosin (MINIPRESS) 2 MG capsule, Take 2 mg by mouth at bedtime., Disp: , Rfl:    sertraline (ZOLOFT) 100 MG tablet, Take 100 mg by mouth daily., Disp: , Rfl:    sildenafil (VIAGRA) 100 MG tablet, Take 100 mg by mouth daily as needed for erectile dysfunction., Disp: , Rfl:     Family History  Problem Relation Age of Onset   Hypertension Mother      Social History   Tobacco Use   Smoking status: Every Day    Pack years: 0.00   Smokeless tobacco:  Never  Vaping Use   Vaping Use: Never used  Substance Use Topics   Alcohol use: Yes    Alcohol/week: 7.0 standard drinks    Types: 7 Cans of beer per week    Comment: DAILY   Drug use: Yes    Types: Cocaine, Marijuana    Comment: last thursday    Allergies as of 11/01/2020 - Review Complete 11/01/2020  Allergen Reaction Noted   Tape Anaphylaxis 08/29/2012   Hydrochlorothiazide w-triamterene Other (See Comments) 02/23/2010   Red dye Nausea And Vomiting 10/01/2013    Review of Systems:    All systems reviewed and negative except where noted in HPI.   Physical Exam:  BP 132/86 (BP Location: Left Arm, Patient Position: Sitting, Cuff Size: Normal)   Pulse 78   Temp 98.4 F (36.9 C) (Oral)   Ht 5\' 8"  (1.727 m)   Wt 143 lb 4 oz (65 kg)   BMI 21.78 kg/m  No LMP for male patient.  General:   Alert, thin built, moderately nourished, pleasant and cooperative in NAD Head:  Normocephalic and atraumatic. Eyes:  Sclera clear, no icterus.  Prosthetic left eye, conjunctiva pink. Ears:  Normal auditory acuity. Nose:  No deformity, discharge, or lesions. Mouth:  No  deformity or lesions,oropharynx pink & moist, absence of teeth Neck:  Supple; no masses or thyromegaly. Lungs:  Respirations even and unlabored.  Clear throughout to auscultation.   No wheezes, crackles, or rhonchi. No acute distress. Heart:  Regular rate and rhythm; no murmurs, clicks, rubs, or gallops. Abdomen:  Normal bowel sounds. Soft, mild epigastric tenderness, moderately distended, tympanic to percussion, without masses, hepatosplenomegaly or hernias noted.  No guarding or rebound tenderness.   Rectal: Not performed Msk:  Symmetrical without gross deformities. Good, equal movement & strength bilaterally. Pulses:  Normal pulses noted. Extremities:  No clubbing or edema.  No cyanosis. Neurologic:  Alert and oriented x3;  grossly normal neurologically. Skin:  Intact without significant lesions or rashes. No jaundice. Psych:  Alert and cooperative. Normal mood and affect.  Imaging Studies: Reviewed  Assessment and Plan:   Jaleal Schliep. is a 60 y.o. male with past history of polysubstance abuse, cocaine use, alcohol abuse, chronic hep C s/p treatment, confirmed eradication is seen for follow-up of exocrine pancreatic insufficiency  Severe EPI: Pancreatic fecal elastase levels are 62 Weight loss has resolved Currently on Creon 72K with each meal and 36K with snack Advised patient to increase to 3 capsules with each meal and 2 with snack as he is still having 3-4 bowel movements daily  Chronic hep C status post treatment, confirmed eradication with SVR as of 2020 No evidence of chronic liver disease based on MRI in 2021  Uncontrolled diabetes Currently on insulin and metformin  Personal history of tubular adenomas of colon Recommend surveillance colonoscopy in 10/2025   Follow up every 6 months or sooner if needed  Dennis Darby, MD

## 2020-12-21 ENCOUNTER — Other Ambulatory Visit: Payer: Self-pay | Admitting: Podiatry

## 2020-12-21 NOTE — Telephone Encounter (Signed)
Please advise 

## 2020-12-22 IMAGING — MR MR ABDOMEN WO/W CM
15 of 18 series · 41 of 48 positions shown · IV contrast (5ml Gadavist)
Comparison: 02/07/2019
COMPARISON: 02/07/2019

Addendum:
CLINICAL DATA: Abdominal pain with diarrhea and weight loss

EXAM:
MRI ABDOMEN WITHOUT AND WITH CONTRAST
TECHNIQUE: Multiplanar multisequence MR imaging of the abdomen was performed
both before and after the administration of intravenous contrast.
CONTRAST:  5mL GADAVIST GADOBUTROL 1 MMOL/ML IV SOLN

[Series 3: T2 · coronal · 6.0mm · 1.19mm/px · 2 of 30 slices shown (1 of 2)]
[im 1/30]
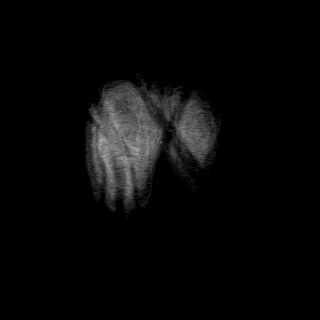
[im 30/30]
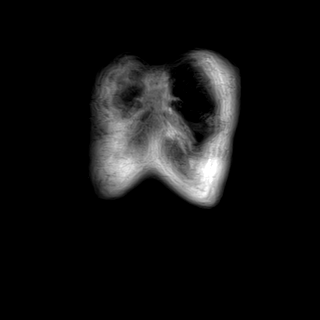

[Series 4: T2 · axial · 6.0mm · 1.19mm/px · z∈[-175,+48]mm · 2 of 32 slices shown (2 of 2)]
[im 1/32]
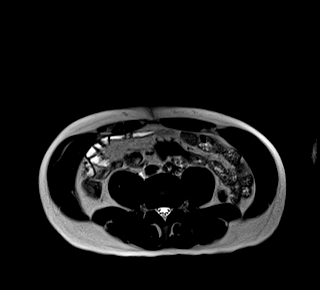
[im 32/32]
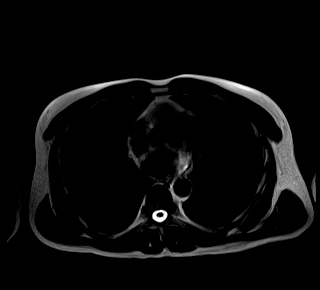

[Series 6: T2 fat-sat · axial · 6.0mm · 1.19mm/px · z∈[-182,+55]mm · 2 of 34 slices shown]
[im 1/34]
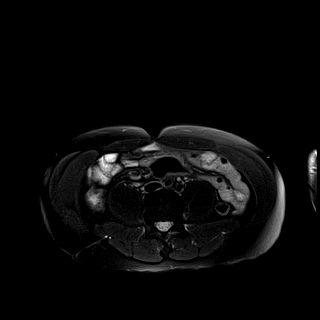
[im 34/34]
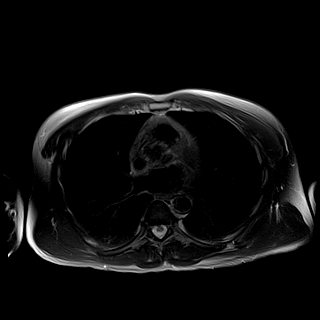

[Series 7: ax dwi_tracew · axial · 6.0mm · 1.42mm/px · z∈[-182,+55]mm · 5 of 102 slices shown]
[im 1/102]
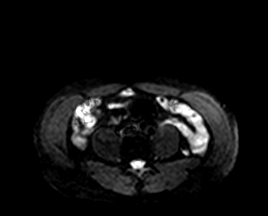
[im 26/102]
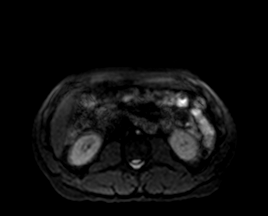
[im 51/102]
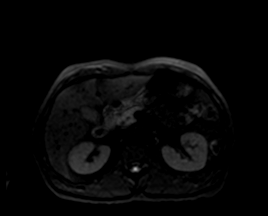
[im 76/102]
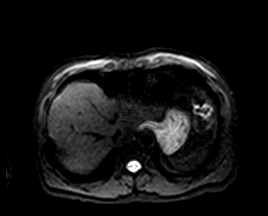
[im 102/102]
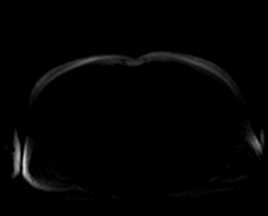

[Series 8: ax dwi_adc · axial · 6.0mm · 1.42mm/px · z∈[-182,+55]mm · 2 of 34 slices shown]
[im 1/34]
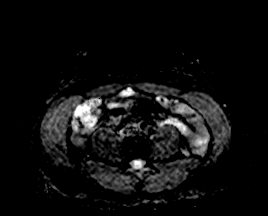
[im 34/34]
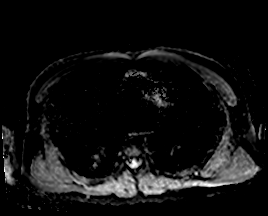

[Series 9: T1 · axial · 6.0mm · 0.74mm/px · z∈[-175,+48]mm · 3 of 64 slices shown]
[im 1/64]
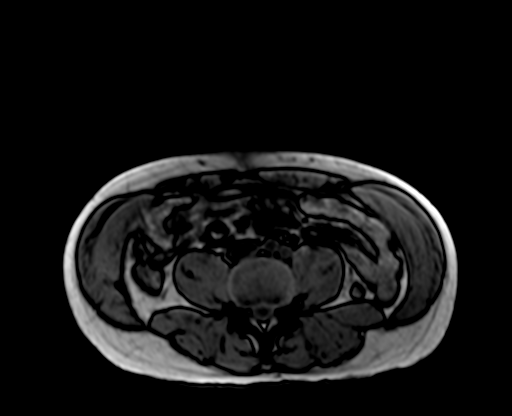
[im 32/64]
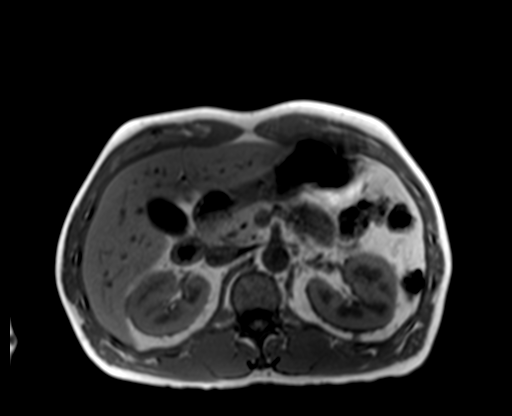
[im 64/64]
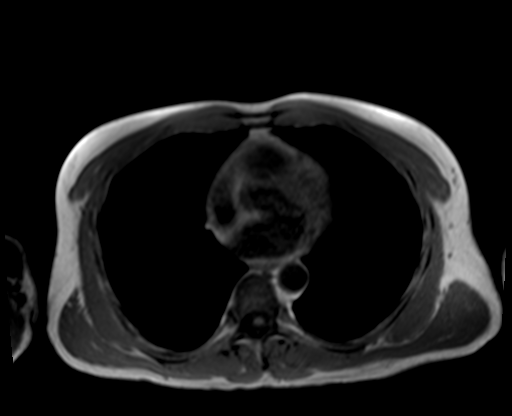

[Series 10: bSSFP · axial · 6.0mm · 0.74mm/px · 1 of 32 slices shown]
[im 1/32]
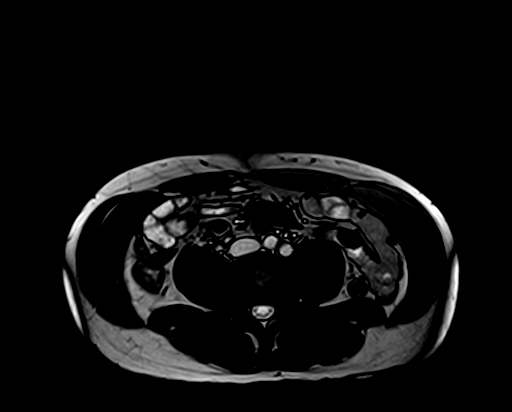

[Series 11: T1 dynamic fat-sat · axial · non-contrast · 3.0mm · 1.19mm/px · z∈[-182,+55]mm · 3 of 80 slices shown (1 of 4)]
[im 1/80]
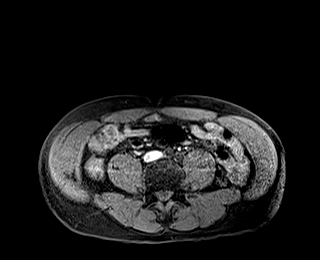
[im 40/80]
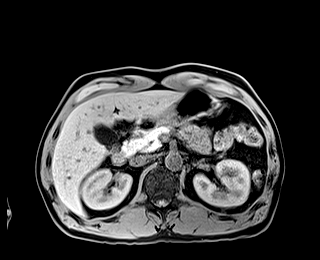
[im 80/80]
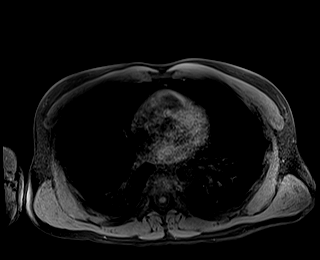

[Series 12: T1 dynamic fat-sat post-contrast · axial · 3.0mm · 1.19mm/px · z∈[-182,+55]mm · 3 of 80 slices shown (1 of 4)]
[im 1/80]
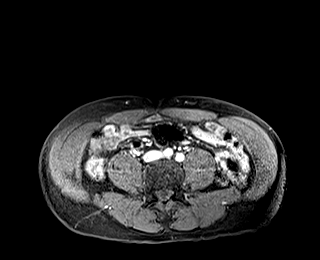
[im 40/80]
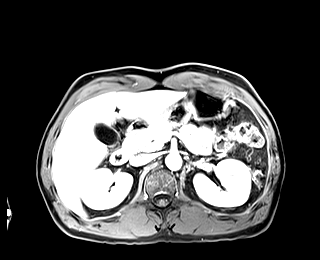
[im 80/80]
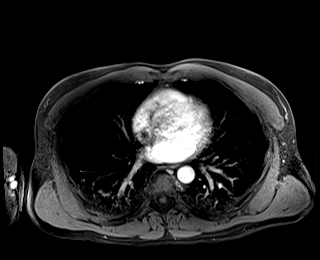

[Series 13: T1 dynamic fat-sat · axial · 3.0mm · 1.19mm/px · z∈[-182,+55]mm · 3 of 80 slices shown (2 of 4)]
[im 1/80]
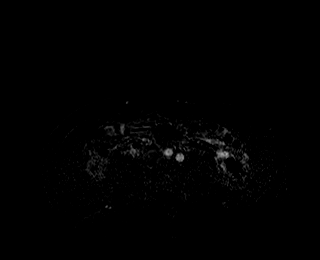
[im 40/80]
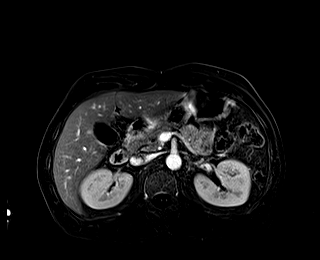
[im 80/80]
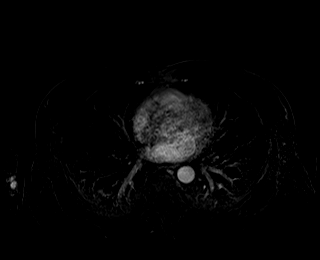

[Series 14: T1 dynamic fat-sat post-contrast · axial · 3.0mm · 1.19mm/px · z∈[-182,+55]mm · 3 of 80 slices shown (2 of 4)]
[im 1/80]
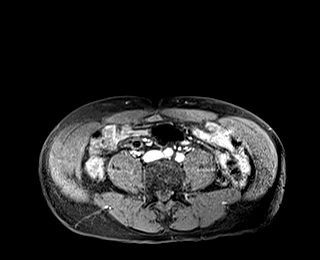
[im 40/80]
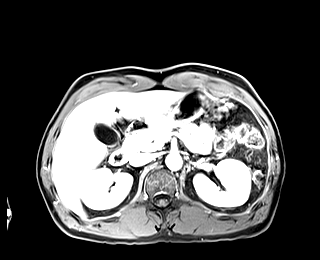
[im 80/80]
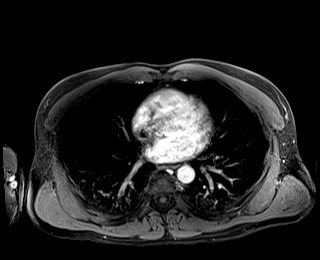

[Series 15: T1 dynamic fat-sat · axial · 3.0mm · 1.19mm/px · z∈[-182,+55]mm · 3 of 80 slices shown (3 of 4)]
[im 1/80]
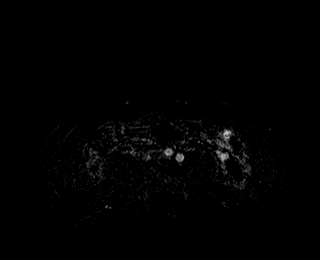
[im 40/80]
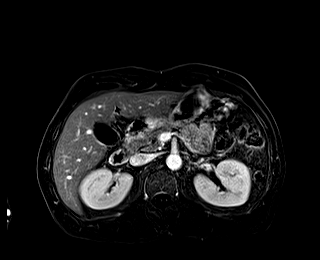
[im 80/80]
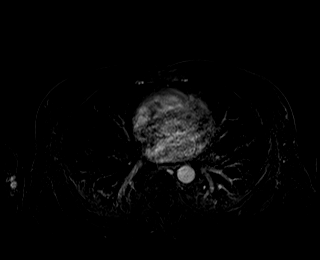

[Series 16: T1 dynamic fat-sat post-contrast · axial · 3.0mm · 1.19mm/px · z∈[-182,+55]mm · 3 of 80 slices shown (3 of 4)]
[im 1/80]
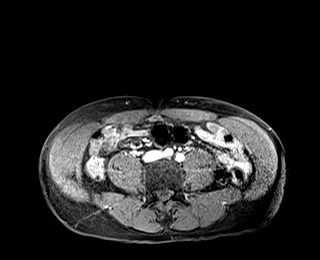
[im 40/80]
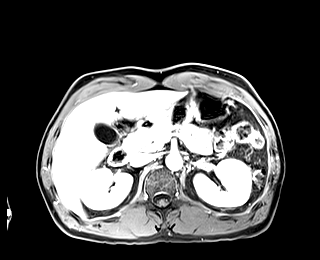
[im 80/80]
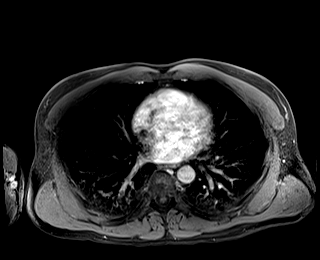

[Series 17: T1 dynamic fat-sat · axial · 3.0mm · 1.19mm/px · z∈[-182,+55]mm · 3 of 80 slices shown (4 of 4)]
[im 1/80]
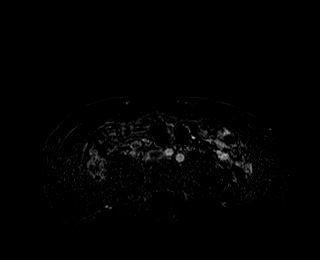
[im 40/80]
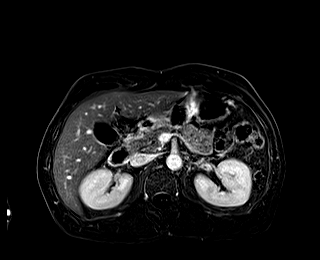
[im 80/80]
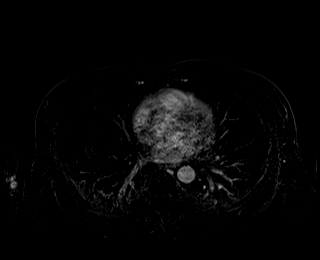

[Series 18: T1 dynamic fat-sat post-contrast · axial · 3.0mm · 1.19mm/px · z∈[-182,+55]mm · 3 of 80 slices shown (4 of 4)]
[im 1/80]
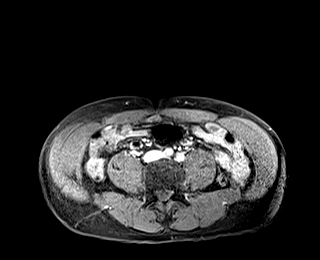
[im 40/80]
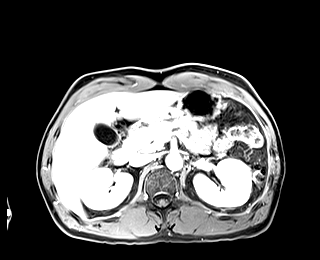
[im 80/80]
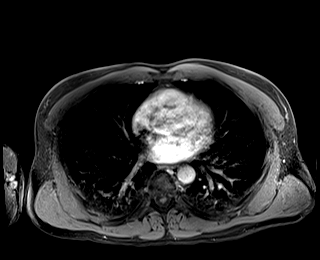

[41 of 48 positions shown; findings below may reference images not displayed]

FINDINGS: Lower chest: Incidental imaging of the lung bases without effusion
or gross consolidation. Limited assessment on MRI.

Hepatobiliary: Liver is normal in size and contour. No biliary duct
dilation, no pericholecystic stranding.

The portal vein is patent.

Hepatic veins are patent. No suspicious hepatic lesion. No signs of
chronic liver disease.

Pancreas:  No peripancreatic stranding ductal dilation or lesion.

Spleen: Low signal in the spleen which is small and loses signal
considerably on in phase as compared to out of phase T1 weighted
gradient echo imaging.

Adrenals/Urinary Tract:  Adrenal glands are normal.

Cortical scarring of the kidneys bilaterally with signs of prior
infarct and or infection in the RIGHT kidney posterolaterally.

Mildly heterogeneous area in the anterior RIGHT kidney (image 51 of
series 18) this measures 10 x 9 mm and shows some mild internal
enhancement but is also associated with suggestion of volume loss in
the kidney in this area. There is no hydronephrosis.

Stomach/Bowel: No acute gastrointestinal process to the extent
evaluated. Pelvic bowel loops not imaged.

Vascular/Lymphatic: No aneurysmal dilation of the abdominal aorta.
No abdominal adenopathy.

Other:  No ascites.

Musculoskeletal: No suspicious bone lesions identified.
IMPRESSION: 1. No sign of hepatic mass. No signs of liver disease or portal
hypertension.
2. Mildly heterogeneous area in the anterior RIGHT kidney measures
10 x 9 mm and shows some mild internal enhancement but is also
associated with suggestion of volume loss in this area. This could
represent cortical scarring or small area of infarct and or prior
infection. It appears slightly smaller than it did on the previous
exam where it measured approximately 12 mm, currently approximately
10 x 9 mm. Consider month follow-up for this area given the internal
enhancement. While sequela of scarring is favored a small renal
neoplasm is not entirely excluded.
3. Iron deposition in a very small spleen. Correlate with any
history of exogenous iron administration based what is seen on the
current study. There is no liver or marrow iron deposition so this
would imply that this is a more chronic process, i.e. no recent iron
administration or ongoing active systemic uptake of iron.

ADDENDUM:
These results will be called to the ordering clinician or
representative by the Radiologist Assistant, and communication
documented in the PACS or [REDACTED].

*** End of Addendum ***
FINDINGS: Lower chest: Incidental imaging of the lung bases without effusion
or gross consolidation. Limited assessment on MRI.

Hepatobiliary: Liver is normal in size and contour. No biliary duct
dilation, no pericholecystic stranding.

The portal vein is patent.

Hepatic veins are patent. No suspicious hepatic lesion. No signs of
chronic liver disease.

Pancreas:  No peripancreatic stranding ductal dilation or lesion.

Spleen: Low signal in the spleen which is small and loses signal
considerably on in phase as compared to out of phase T1 weighted
gradient echo imaging.

Adrenals/Urinary Tract:  Adrenal glands are normal.

Cortical scarring of the kidneys bilaterally with signs of prior
infarct and or infection in the RIGHT kidney posterolaterally.

Mildly heterogeneous area in the anterior RIGHT kidney (image 51 of
series 18) this measures 10 x 9 mm and shows some mild internal
enhancement but is also associated with suggestion of volume loss in
the kidney in this area. There is no hydronephrosis.

Stomach/Bowel: No acute gastrointestinal process to the extent
evaluated. Pelvic bowel loops not imaged.

Vascular/Lymphatic: No aneurysmal dilation of the abdominal aorta.
No abdominal adenopathy.

Other:  No ascites.

Musculoskeletal: No suspicious bone lesions identified.
IMPRESSION: 1. No sign of hepatic mass. No signs of liver disease or portal
hypertension.
2. Mildly heterogeneous area in the anterior RIGHT kidney measures
10 x 9 mm and shows some mild internal enhancement but is also
associated with suggestion of volume loss in this area. This could
represent cortical scarring or small area of infarct and or prior
infection. It appears slightly smaller than it did on the previous
exam where it measured approximately 12 mm, currently approximately
10 x 9 mm. Consider month follow-up for this area given the internal
enhancement. While sequela of scarring is favored a small renal
neoplasm is not entirely excluded.
3. Iron deposition in a very small spleen. Correlate with any
history of exogenous iron administration based what is seen on the
current study. There is no liver or marrow iron deposition so this
would imply that this is a more chronic process, i.e. no recent iron
administration or ongoing active systemic uptake of iron.

## 2020-12-22 NOTE — Telephone Encounter (Signed)
Mobic 15 mg.  # 30.

## 2021-01-20 ENCOUNTER — Encounter (INDEPENDENT_AMBULATORY_CARE_PROVIDER_SITE_OTHER): Payer: Self-pay

## 2021-01-20 ENCOUNTER — Other Ambulatory Visit: Payer: Self-pay

## 2021-01-20 ENCOUNTER — Encounter: Payer: Self-pay | Admitting: Podiatry

## 2021-01-20 ENCOUNTER — Ambulatory Visit (INDEPENDENT_AMBULATORY_CARE_PROVIDER_SITE_OTHER): Payer: 59 | Admitting: Podiatry

## 2021-01-20 DIAGNOSIS — M205X1 Other deformities of toe(s) (acquired), right foot: Secondary | ICD-10-CM

## 2021-01-20 DIAGNOSIS — B351 Tinea unguium: Secondary | ICD-10-CM

## 2021-01-20 DIAGNOSIS — M2010 Hallux valgus (acquired), unspecified foot: Secondary | ICD-10-CM

## 2021-01-20 DIAGNOSIS — M79676 Pain in unspecified toe(s): Secondary | ICD-10-CM | POA: Diagnosis not present

## 2021-01-20 DIAGNOSIS — L84 Corns and callosities: Secondary | ICD-10-CM

## 2021-01-20 DIAGNOSIS — E1142 Type 2 diabetes mellitus with diabetic polyneuropathy: Secondary | ICD-10-CM | POA: Diagnosis not present

## 2021-01-20 DIAGNOSIS — Q828 Other specified congenital malformations of skin: Secondary | ICD-10-CM

## 2021-01-20 NOTE — Progress Notes (Signed)
This patient returns to the office for evaluation and treatment of long thick painful nails .  This patient is unable to trim his own nails since the patient cannot reach the feet.  Patient says the nails are painful walking and wearing his shoes.  He also has callus heel  B/L . These calluses are painful walking and wearing his shoes.  Denies trauma.  No self treatment was afforded. He returns for preventive foot care services.  General Appearance  Alert, conversant and in no acute stress.  Vascular  Dorsalis pedis and posterior tibial  pulses are palpable  bilaterally.  Capillary return is within normal limits  bilaterally. Temperature is within normal limits  bilaterally.  Neurologic  Senn-Weinstein monofilament wire test within normal limits  bilaterally. Muscle power within normal limits bilaterally.  Nails Thick disfigured discolored nails with subungual debris  from hallux to fifth toes bilaterally. No evidence of bacterial infection or drainage bilaterally. Pain proximal nail fold left hallux.  Orthopedic  No limitations of motion  feet .  No crepitus or effusions noted.  No bony pathology or digital deformities noted.   Skin  normotropic skin with no porokeratosis noted bilaterally.  No signs of infections or ulcers noted.   Pinch callus right hallux.  Porokeratosis sub 5th right foot.  Callus/crust  Medial and lateral aspect heels  B/L. lateral aspect heel  B/L.  Onychomycosis  Pain in toes right foot  Pain in toes left foot  Debridement  of nails  1-5  B/L with a nail nipper.  Nails were then filed using a dremel tool with no incidents.    . Debride callus with # 15 blade and dremel tool..  RTC 9 weeks    Gardiner Barefoot DPM

## 2021-04-04 ENCOUNTER — Ambulatory Visit (INDEPENDENT_AMBULATORY_CARE_PROVIDER_SITE_OTHER): Payer: 59 | Admitting: Podiatry

## 2021-04-04 ENCOUNTER — Other Ambulatory Visit: Payer: Self-pay

## 2021-04-04 ENCOUNTER — Encounter: Payer: Self-pay | Admitting: Podiatry

## 2021-04-04 DIAGNOSIS — L84 Corns and callosities: Secondary | ICD-10-CM | POA: Diagnosis not present

## 2021-04-04 DIAGNOSIS — E1142 Type 2 diabetes mellitus with diabetic polyneuropathy: Secondary | ICD-10-CM | POA: Diagnosis not present

## 2021-04-04 DIAGNOSIS — M205X1 Other deformities of toe(s) (acquired), right foot: Secondary | ICD-10-CM

## 2021-04-04 DIAGNOSIS — B351 Tinea unguium: Secondary | ICD-10-CM | POA: Diagnosis not present

## 2021-04-04 DIAGNOSIS — M2010 Hallux valgus (acquired), unspecified foot: Secondary | ICD-10-CM

## 2021-04-04 DIAGNOSIS — Q828 Other specified congenital malformations of skin: Secondary | ICD-10-CM | POA: Diagnosis not present

## 2021-04-04 DIAGNOSIS — M79676 Pain in unspecified toe(s): Secondary | ICD-10-CM | POA: Diagnosis not present

## 2021-04-04 NOTE — Progress Notes (Signed)
This patient returns to the office for evaluation and treatment of long thick painful nails .  This patient is unable to trim his own nails since the patient cannot reach the feet.  Patient says the nails are painful walking and wearing his shoes.  He also has callus heel  B/L . These calluses are painful walking and wearing his shoes.  Denies trauma.  No self treatment was afforded. He returns for preventive foot care services.  General Appearance  Alert, conversant and in no acute stress.  Vascular  Dorsalis pedis and posterior tibial  pulses are palpable  bilaterally.  Capillary return is within normal limits  bilaterally. Temperature is within normal limits  bilaterally.  Neurologic  Senn-Weinstein monofilament wire test within normal limits  bilaterally. Muscle power within normal limits bilaterally.  Nails Thick disfigured discolored nails with subungual debris  from hallux to fifth toes bilaterally. No evidence of bacterial infection or drainage bilaterally. Pain proximal nail fold left hallux.  Orthopedic  No limitations of motion  feet .  No crepitus or effusions noted.  No bony pathology or digital deformities noted.   Skin  normotropic skin with no porokeratosis noted bilaterally.  No signs of infections or ulcers noted.   Pinch callus right hallux.  Porokeratosis sub 5th right foot.  Callus/crust  Medial and lateral aspect heels  B/L. lateral aspect heel  B/L.  Onychomycosis  Pain in toes right foot  Pain in toes left foot  Debridement  of nails  1-5  B/L with a nail nipper.  Nails were then filed using a dremel tool with no incidents.    . Debride callus with # 15 blade and dremel tool..  RTC 9 weeks    Gardiner Barefoot DPM

## 2021-05-19 ENCOUNTER — Encounter: Payer: Self-pay | Admitting: Podiatry

## 2021-05-27 ENCOUNTER — Ambulatory Visit: Payer: 59

## 2021-05-27 ENCOUNTER — Other Ambulatory Visit: Payer: Self-pay

## 2021-05-27 DIAGNOSIS — E1142 Type 2 diabetes mellitus with diabetic polyneuropathy: Secondary | ICD-10-CM

## 2021-05-27 DIAGNOSIS — M2010 Hallux valgus (acquired), unspecified foot: Secondary | ICD-10-CM

## 2021-05-27 NOTE — Progress Notes (Signed)
SITUATION Reason for Consult: Evaluation for Prefabricated Diabetic Shoes and Bilateral Custom Diabetic Inserts. Patient / Caregiver Report: Patient would like well fitting shoes  OBJECTIVE DATA: Patient History / Diagnosis:    ICD-10-CM   1. Diabetic polyneuropathy associated with type 2 diabetes mellitus (HCC)  E11.42     2. Hav (hallux abducto valgus), unspecified laterality  M20.10       Current or Previous Devices:   Historical, but patient was not wearing any  In-Person Foot Examination: Ulcers & Callousing:   Medial hallux and 1st met head  Toe / Foot Deformities:   - Pes Planus  - hallux valgus   Shoe Size: 9W  ORTHOTIC RECOMMENDATION Recommended Devices: - 1x pair prefabricated PDAC approved diabetic shoes: Apex [id number] 9W - 3x pair [id number] custom-to-patient vacuum formed diabetic insoles.   GOALS OF SHOES AND INSOLES - Reduce shear and pressure - Reduce / Prevent callus formation - Reduce / Prevent ulceration - Protect the fragile healing compromised diabetic foot.  Patient would benefit from diabetic shoes and inserts as patient has diabetes mellitus and the patient has one or more of the following conditions: - History of partial or complete amputation of the foot - History of previous foot ulceration. - History of pre-ulcerative callus - Peripheral neuropathy with evidence of callus formation - Foot deformity - Poor circulation  ACTIONS PERFORMED Patient was casted for insoles via crush box and measured for shoes via brannock device. Procedure was explained and patient tolerated procedure well. All questions were answered and concerns addressed.  PLAN Patient is to ensure treating physician receives and completes diabetic paperwork. Casts and shoe order are to be held until paperwork is received. Once received patient is to be scheduled for fitting in four weeks.

## 2021-06-09 ENCOUNTER — Ambulatory Visit: Payer: 59 | Admitting: Podiatry

## 2021-06-20 ENCOUNTER — Telehealth: Payer: Self-pay | Admitting: *Deleted

## 2021-06-20 NOTE — Telephone Encounter (Signed)
"  I need a piece of paper that states I cannot stand on my feet for eight hours.  I need to have it by Wednesday.  Please give me a call back."

## 2021-06-23 ENCOUNTER — Other Ambulatory Visit: Payer: Self-pay

## 2021-06-23 ENCOUNTER — Ambulatory Visit: Payer: 59 | Admitting: Podiatry

## 2021-06-23 ENCOUNTER — Ambulatory Visit (INDEPENDENT_AMBULATORY_CARE_PROVIDER_SITE_OTHER): Payer: 59 | Admitting: Podiatry

## 2021-06-23 ENCOUNTER — Encounter: Payer: Self-pay | Admitting: Podiatry

## 2021-06-23 DIAGNOSIS — B351 Tinea unguium: Secondary | ICD-10-CM | POA: Diagnosis not present

## 2021-06-23 DIAGNOSIS — M79675 Pain in left toe(s): Secondary | ICD-10-CM

## 2021-06-23 DIAGNOSIS — Q828 Other specified congenital malformations of skin: Secondary | ICD-10-CM | POA: Diagnosis not present

## 2021-06-23 DIAGNOSIS — E1140 Type 2 diabetes mellitus with diabetic neuropathy, unspecified: Secondary | ICD-10-CM | POA: Insufficient documentation

## 2021-06-23 DIAGNOSIS — E1142 Type 2 diabetes mellitus with diabetic polyneuropathy: Secondary | ICD-10-CM | POA: Diagnosis not present

## 2021-06-23 DIAGNOSIS — M79674 Pain in right toe(s): Secondary | ICD-10-CM | POA: Diagnosis not present

## 2021-06-23 NOTE — Progress Notes (Signed)
This patient returns to the office for evaluation and treatment of long thick painful nails .  This patient is unable to trim his own nails since the patient cannot reach the feet.  Patient says the nails are painful walking and wearing his shoes.  He also has callus heel  B/L . These calluses are painful walking and wearing his shoes.  Denies trauma.  No self treatment was afforded. He returns for preventive foot care services.  General Appearance  Alert, conversant and in no acute stress.  Vascular  Dorsalis pedis and posterior tibial  pulses are palpable  bilaterally.  Capillary return is within normal limits  bilaterally. Temperature is within normal limits  bilaterally.  Neurologic  Senn-Weinstein monofilament wire test within normal limits  bilaterally. Muscle power within normal limits bilaterally.  Nails Thick disfigured discolored nails with subungual debris  from hallux to fifth toes bilaterally. No evidence of bacterial infection or drainage bilaterally.   Orthopedic  No limitations of motion  feet .  No crepitus or effusions noted.  No bony pathology or digital deformities noted. HAV  B/L.  Hammer toes  B/L.  Skin  normotropic skin with no porokeratosis noted bilaterally.  No signs of infections or ulcers noted.   Pinch callus right hallux.  Porokeratosis sub 5th right foot.  Callus/crust  Medial and lateral aspect heels  B/L. lateral aspect heel  B/L.  Onychomycosis  Pain in toes right foot  Pain in toes left foot  Porokeratosis right foot.  Heel callus  B/L.  Debridement  of nails  1-5  B/L with a nail nipper.  Nails were then filed using a dremel tool with no incidents.    . Debride callus with # 15 blade and dremel tool..  RTC 9 weeks .  Patient waiting on his diabetic shoes.   Gardiner Barefoot DPM

## 2021-07-15 ENCOUNTER — Telehealth: Payer: Self-pay

## 2021-07-15 NOTE — Telephone Encounter (Signed)
Casts sent to [employer] - HOLD FOR CMN ?

## 2021-09-08 ENCOUNTER — Ambulatory Visit (INDEPENDENT_AMBULATORY_CARE_PROVIDER_SITE_OTHER): Payer: 59 | Admitting: Podiatry

## 2021-09-08 ENCOUNTER — Encounter: Payer: Self-pay | Admitting: Podiatry

## 2021-09-08 DIAGNOSIS — B351 Tinea unguium: Secondary | ICD-10-CM

## 2021-09-08 DIAGNOSIS — Q828 Other specified congenital malformations of skin: Secondary | ICD-10-CM | POA: Diagnosis not present

## 2021-09-08 DIAGNOSIS — M79674 Pain in right toe(s): Secondary | ICD-10-CM | POA: Diagnosis not present

## 2021-09-08 DIAGNOSIS — M2010 Hallux valgus (acquired), unspecified foot: Secondary | ICD-10-CM

## 2021-09-08 DIAGNOSIS — M79675 Pain in left toe(s): Secondary | ICD-10-CM

## 2021-09-08 DIAGNOSIS — M205X1 Other deformities of toe(s) (acquired), right foot: Secondary | ICD-10-CM

## 2021-09-08 DIAGNOSIS — L84 Corns and callosities: Secondary | ICD-10-CM | POA: Diagnosis not present

## 2021-09-08 DIAGNOSIS — E1142 Type 2 diabetes mellitus with diabetic polyneuropathy: Secondary | ICD-10-CM | POA: Diagnosis not present

## 2021-09-08 NOTE — Progress Notes (Signed)
This patient returns to the office for evaluation and treatment of long thick painful nails .  This patient is unable to trim his own nails since the patient cannot reach the feet.  Patient says the nails are painful walking and wearing his shoes.  He also has callus heel  B/L . These calluses are painful walking and wearing his shoes.    No self treatment was afforded. He returns for preventive foot care services. ? ?General Appearance  Alert, conversant and in no acute stress. ? ?Vascular  Dorsalis pedis and posterior tibial  pulses are palpable  bilaterally.  Capillary return is within normal limits  bilaterally. Temperature is within normal limits  bilaterally. ? ?Neurologic  Senn-Weinstein monofilament wire test within normal limits  bilaterally. Muscle power within normal limits bilaterally. ? ?Nails Thick disfigured discolored nails with subungual debris  from hallux to fifth toes bilaterally. No evidence of bacterial infection or drainage bilaterally.  ? ?Orthopedic  No limitations of motion  feet .  No crepitus or effusions noted.  No bony pathology or digital deformities noted. HAV  B/L.  Hammer toes  B/L. ? ?Skin  normotropic skin with no porokeratosis noted bilaterally.  No signs of infections or ulcers noted.   Pinch callus right hallux.  Porokeratosis sub 5th right foot.  Callus/crust  Medial and lateral aspect heels  B/L. lateral aspect heel  B/L. ? ?Onychomycosis  Pain in toes right foot  Pain in toes left foot  Porokeratosis right foot.  Heel callus  B/L. ? ?Debridement  of nails  1-5  B/L with a nail nipper.  Nails were then filed using a dremel tool with no incidents.    . Debride callus with # 15 blade and dremel tool..  RTC 9 weeks .   ? ? ?Gardiner Barefoot DPM  ?

## 2021-09-20 ENCOUNTER — Telehealth: Payer: Self-pay

## 2021-09-20 NOTE — Telephone Encounter (Signed)
Shoes Ordered - [employer] [id number] ?

## 2021-09-22 ENCOUNTER — Ambulatory Visit: Payer: 59 | Admitting: Podiatry

## 2021-10-27 ENCOUNTER — Ambulatory Visit (INDEPENDENT_AMBULATORY_CARE_PROVIDER_SITE_OTHER): Payer: 59 | Admitting: Urology

## 2021-10-27 ENCOUNTER — Encounter: Payer: Self-pay | Admitting: Urology

## 2021-10-27 VITALS — BP 145/88 | HR 84 | Ht 68.0 in | Wt 143.0 lb

## 2021-10-27 DIAGNOSIS — N3289 Other specified disorders of bladder: Secondary | ICD-10-CM

## 2021-10-27 DIAGNOSIS — R1030 Lower abdominal pain, unspecified: Secondary | ICD-10-CM | POA: Diagnosis not present

## 2021-10-27 DIAGNOSIS — Z125 Encounter for screening for malignant neoplasm of prostate: Secondary | ICD-10-CM

## 2021-10-27 DIAGNOSIS — N401 Enlarged prostate with lower urinary tract symptoms: Secondary | ICD-10-CM | POA: Diagnosis not present

## 2021-10-27 LAB — URINALYSIS, COMPLETE
Bilirubin, UA: NEGATIVE
Ketones, UA: NEGATIVE
Leukocytes,UA: NEGATIVE
Nitrite, UA: NEGATIVE
Protein,UA: NEGATIVE
RBC, UA: NEGATIVE
Specific Gravity, UA: 1.025 (ref 1.005–1.030)
Urobilinogen, Ur: 0.2 mg/dL (ref 0.2–1.0)
pH, UA: 5.5 (ref 5.0–7.5)

## 2021-10-27 LAB — MICROSCOPIC EXAMINATION: Bacteria, UA: NONE SEEN

## 2021-10-27 MED ORDER — TAMSULOSIN HCL 0.4 MG PO CAPS: 0.4000 mg | ORAL_CAPSULE | Freq: Every day | ORAL | 0 refills | Status: AC

## 2021-10-27 NOTE — Progress Notes (Unsigned)
10/27/2021 9:43 AM   Val Riles. [birth year] [id number]  Referring provider: Mechele Claude, [address] [place],  [place] [address]  Chief Complaint  Patient presents with   New Patient (Initial Visit)    HPI: Dennis Rowland. is a 61 y.o. male referred for evaluation of bladder wall thickening.  I had office CT performed which apparently showed bladder wall thickening Has urinary frequency and occasional decreased stream Several month history of pain located in the right upper/lower quadrant which is intermittent.  Will radiate along the right costal margin to the back.  No precipitating, aggravating or alleviating factors Denies dysuria, gross hematuria  PMH: Past Medical History:  Diagnosis Date   Arthritis    Chronic sniffling    Diabetes ([place])    Dyspnea    some doe   Elevated LFTs 02/05/2019   GERD (gastroesophageal reflux disease)    Gout    Hyperlipidemia    Hypertension    Neuropathy     Surgical History: Past Surgical History:  Procedure Laterality Date   COLONOSCOPY N/A 10/26/2020   Procedure: COLONOSCOPY;  Surgeon: Lin Landsman, MD;  Location: [facility] END[facility]COPY;  Service: Gastroenterology;  Laterality: N/A;   COLONOSCOPY WITH PROPOFOL     NO PAST SURGERIES      Home Medications:  Allergies as of 10/27/2021       Reactions   Bee Venom Anaphylaxis   Tape Anaphylaxis   Hydrochlorothiazide W-triamterene Other (See Comments)   Unknown Other reaction(s): Other (See Comments), Unknown Unknown   Iodinated Contrast Media    Other reaction(s): Other (See Comments) SEIZURES   Red Dye Nausea And Vomiting        Medication List        Accurate as of October 27, 2021  9:43 AM. If you have any questions, ask your nurse or doctor.          STOP taking these medications    gabapentin 300 MG capsule Commonly known as: NEURONTIN Stopped by: Abbie Sons, MD   meloxicam 7.5 MG tablet Commonly known as: MOBIC Stopped by:  Abbie Sons, MD   metFORMIN 850 MG tablet Commonly known as: GLUCOPHAGE Stopped by: Abbie Sons, MD       TAKE these medications    allopurinol 100 MG tablet Commonly known as: ZYLOPRIM Take 100 mg by mouth daily.   ammonium lactate 12 % cream Commonly known as: Lac-Hydrin Apply topically as needed for dry skin.   aspirin EC 81 MG tablet Take 1 tablet (81 mg total) by mouth daily.   celecoxib 100 MG capsule Commonly known as: CELEBREX Take 100 mg by mouth 2 (two) times daily.   cetirizine 10 MG tablet Commonly known as: ZYRTEC Take 1 tablet (10 mg total) by mouth daily.   colchicine 0.6 MG tablet Take 0.6 mg by mouth daily.   EPINEPHrine 0.3 mg/0.3 mL Soaj injection Commonly known as: EPI-PEN Inject into the muscle as directed.   Farxiga 10 MG Tabs tablet Generic drug: dapagliflozin propanediol Take 10 mg by mouth daily.   insulin detemir 100 UNIT/ML FlexPen Commonly known as: LEVEMIR Inject 40 Units into the skin at bedtime.   lipase/protease/amylase 36000 UNITS Cpep capsule Commonly known as: Creon TAKE 2 CAPSULE WITH THE FIRST BITE OF EACH MEAL AND 1 CAPSULE WITH THE FIRST BITE OF SNACK   lisinopril 5 MG tablet Commonly known as: ZESTRIL Take 5 mg by mouth daily.   omeprazole 20 MG  capsule Commonly known as: PRILOSEC Take 20 mg by mouth daily.   Ozempic (0.25 or 0.5 MG/DOSE) 2 MG/1.5ML Sopn Generic drug: Semaglutide(0.25 or 0.'5MG'$ /DOS) Inject 0.5 mg into the skin once a week.   pantoprazole 40 MG tablet Commonly known as: PROTONIX Take 1 tablet (40 mg total) by mouth daily.   prazosin 2 MG capsule Commonly known as: MINIPRESS Take 2 mg by mouth at bedtime.   sertraline 100 MG tablet Commonly known as: ZOLOFT Take 100 mg by mouth daily.   sildenafil 100 MG tablet Commonly known as: VIAGRA Take 100 mg by mouth daily as needed for erectile dysfunction.        Allergies:  Allergies  Allergen Reactions   Bee Venom Anaphylaxis    Tape Anaphylaxis   Hydrochlorothiazide W-Triamterene Other (See Comments)    Unknown Other reaction(s): Other (See Comments), Unknown Unknown   Iodinated Contrast Media     Other reaction(s): Other (See Comments) SEIZURES   Red Dye Nausea And Vomiting    Family History: Family History  Problem Relation Age of Onset   Hypertension Mother     Social History:  reports that he has been smoking. He has never used smokeless tobacco. He reports current alcohol use of about 7.0 standard drinks of alcohol per week. He reports current drug use. Drugs: Cocaine and Marijuana.   Physical Exam: BP (!) 145/88   Pulse 84   Ht '5\' 8"'$  (1.727 m)   Wt 143 lb (64.9 kg)   BMI 21.74 kg/m   Constitutional:  Alert, no acute distress. HEENT: [place] AT Respiratory: Normal respiratory effort, no increased work of breathing. GI: Abdomen is soft, nontender, nondistended, no abdominal masses GU: Prostate 40 g, smooth without nodules Skin: No rashes, bruises or suspicious lesions. Neurologic: Grossly intact, no focal deficits, moving all 4 extremities. Psychiatric: Normal mood and affect.  Laboratory Data:  Urinalysis Dipstick 2+ glucose Microscopy negative   Pertinent Imaging: CT images were not available for review  Assessment & Plan:    1. Bladder wall thickening CT images not available for review We discussed bladder wall thickening is typically related to prostate enlargement and symmetric thickening is not concerning Will request a CD of his CT for review  2.  BPH with LUTS Trial tamsulosin 0.4 mg daily  3.  Abdominal pain CT report was not available for review.  No GU findings were mention other than bladder wall thickening which would not be a cause of his pain Prostate nontender on DRE and no pelvic floor tenderness  4.  Prostate cancer screening Benign DRE No PSA on review of faxed records and PSA drawn today    Abbie Sons, MD  [facility] [address], [place], [place] [address] [phone]

## 2021-10-28 ENCOUNTER — Telehealth: Payer: Self-pay | Admitting: Family Medicine

## 2021-10-28 LAB — PSA: Prostate Specific Ag, Serum: 1.9 ng/mL (ref 0.0–4.0)

## 2021-10-28 NOTE — Telephone Encounter (Signed)
Patient notified and voiced understanding.

## 2021-10-28 NOTE — Telephone Encounter (Signed)
-----   Message from Abbie Sons, MD sent at 10/28/2021  7:31 AM EDT ----- PSA normal 1.9

## 2021-11-19 ENCOUNTER — Other Ambulatory Visit: Payer: Self-pay | Admitting: Urology

## 2021-11-28 ENCOUNTER — Ambulatory Visit: Payer: 59 | Admitting: Podiatry

## 2021-12-26 ENCOUNTER — Ambulatory Visit (INDEPENDENT_AMBULATORY_CARE_PROVIDER_SITE_OTHER): Payer: 59 | Admitting: Podiatry

## 2021-12-26 ENCOUNTER — Encounter: Payer: Self-pay | Admitting: Podiatry

## 2021-12-26 DIAGNOSIS — Q828 Other specified congenital malformations of skin: Secondary | ICD-10-CM | POA: Diagnosis not present

## 2021-12-26 DIAGNOSIS — E1142 Type 2 diabetes mellitus with diabetic polyneuropathy: Secondary | ICD-10-CM | POA: Diagnosis not present

## 2021-12-26 DIAGNOSIS — M79675 Pain in left toe(s): Secondary | ICD-10-CM

## 2021-12-26 DIAGNOSIS — M2011 Hallux valgus (acquired), right foot: Secondary | ICD-10-CM

## 2021-12-26 DIAGNOSIS — M79674 Pain in right toe(s): Secondary | ICD-10-CM | POA: Diagnosis not present

## 2021-12-26 DIAGNOSIS — B351 Tinea unguium: Secondary | ICD-10-CM

## 2021-12-26 DIAGNOSIS — M2010 Hallux valgus (acquired), unspecified foot: Secondary | ICD-10-CM

## 2021-12-26 DIAGNOSIS — L84 Corns and callosities: Secondary | ICD-10-CM | POA: Diagnosis not present

## 2021-12-26 MED ORDER — AMMONIUM LACTATE 12 % EX LOTN: TOPICAL_LOTION | CUTANEOUS | Status: AC | PRN

## 2021-12-26 NOTE — Progress Notes (Signed)
This patient returns to the office for evaluation and treatment of long thick painful nails .  This patient is unable to trim his own nails since the patient cannot reach the feet.  Patient says the nails are painful walking and wearing his shoes.  He also has callus heel  B/L . These calluses are painful walking and wearing his shoes.  He also has painful callus under the outside ball of his right foot.   No self treatment was afforded. He returns for preventive foot care services.  General Appearance  Alert, conversant and in no acute stress.  Vascular  Dorsalis pedis and posterior tibial  pulses are palpable  bilaterally.  Capillary return is within normal limits  bilaterally. Temperature is within normal limits  bilaterally.  Neurologic  Senn-Weinstein monofilament wire test within normal limits  bilaterally. Muscle power within normal limits bilaterally.  Nails Thick disfigured discolored nails with subungual debris  from hallux to fifth toes bilaterally. No evidence of bacterial infection or drainage bilaterally.   Orthopedic  No limitations of motion  feet .  No crepitus or effusions noted.  No bony pathology or digital deformities noted. HAV  B/L.  Hammer toes  B/L.  Skin  normotropic skin with no porokeratosis noted bilaterally.  No signs of infections or ulcers noted.   Pinch callus hallux  bilateral..  Porokeratosis sub 5th right foot.  Callus/crust  Medial and lateral aspect heels  B/L. lateral aspect heel  B/L.  Onychomycosis  Pain in toes right foot  Pain in toes left foot  Porokeratosis right foot.  Heel callus  B/L.  Debridement  of nails  1-5  B/L with a nail nipper.  Nails were then filed using a dremel tool with no incidents.    . Debride callus with # 15 blade and dremel tool..  Dispense diabetic shoes and added padding sub 5th met right foot.   RTC 9 weeks .     Gardiner Barefoot DPM

## 2021-12-28 ENCOUNTER — Other Ambulatory Visit: Payer: Self-pay | Admitting: Urology

## 2022-03-06 ENCOUNTER — Emergency Department: Payer: 59

## 2022-03-06 ENCOUNTER — Other Ambulatory Visit: Payer: Self-pay

## 2022-03-06 ENCOUNTER — Emergency Department
Admission: EM | Admit: 2022-03-06 | Discharge: 2022-03-06 | Disposition: A | Discharge status: Home / Self Care | Payer: 59 | Attending: Emergency Medicine | Admitting: Emergency Medicine

## 2022-03-06 DIAGNOSIS — S40011A Contusion of right shoulder, initial encounter: Secondary | ICD-10-CM | POA: Insufficient documentation

## 2022-03-06 DIAGNOSIS — S4991XA Unspecified injury of right shoulder and upper arm, initial encounter: Secondary | ICD-10-CM | POA: Diagnosis present

## 2022-03-06 DIAGNOSIS — Y92410 Unspecified street and highway as the place of occurrence of the external cause: Secondary | ICD-10-CM | POA: Insufficient documentation

## 2022-03-06 DIAGNOSIS — S8001XA Contusion of right knee, initial encounter: Secondary | ICD-10-CM | POA: Diagnosis not present

## 2022-03-06 MED ORDER — METHOCARBAMOL 500 MG PO TABS: 500.0000 mg | ORAL_TABLET | Freq: Four times a day (QID) | ORAL | 0 refills | Status: AC

## 2022-03-06 MED ORDER — MELOXICAM 7.5 MG PO TABS
15.0000 mg | ORAL_TABLET | Freq: Once | ORAL | Status: AC
  Administered 2022-03-06: 15 mg via ORAL
  Filled 2022-03-06: qty 2

## 2022-03-06 MED ORDER — METHOCARBAMOL 500 MG PO TABS
1000.0000 mg | ORAL_TABLET | Freq: Once | ORAL | Status: AC
  Administered 2022-03-06: 1000 mg via ORAL
  Filled 2022-03-06: qty 2

## 2022-03-06 MED ORDER — MELOXICAM 15 MG PO TABS: 15.0000 mg | ORAL_TABLET | Freq: Every day | ORAL | 0 refills | Status: AC
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
## 2022-03-06 NOTE — ED Notes (Signed)
Dc instructions and scripts reviewed with pt no questions or concerns at this time. Will follow up with pcp as needed 

## 2022-04-13 ENCOUNTER — Encounter: Payer: Self-pay | Admitting: Podiatry

## 2022-04-13 ENCOUNTER — Ambulatory Visit (INDEPENDENT_AMBULATORY_CARE_PROVIDER_SITE_OTHER): Payer: 59 | Admitting: Podiatry

## 2022-04-13 DIAGNOSIS — L84 Corns and callosities: Secondary | ICD-10-CM | POA: Diagnosis not present

## 2022-04-13 DIAGNOSIS — M79675 Pain in left toe(s): Secondary | ICD-10-CM | POA: Diagnosis not present

## 2022-04-13 DIAGNOSIS — B351 Tinea unguium: Secondary | ICD-10-CM

## 2022-04-13 DIAGNOSIS — Q828 Other specified congenital malformations of skin: Secondary | ICD-10-CM | POA: Diagnosis not present

## 2022-04-13 DIAGNOSIS — M79674 Pain in right toe(s): Secondary | ICD-10-CM | POA: Diagnosis not present

## 2022-04-13 NOTE — Progress Notes (Signed)
This patient returns to the office for evaluation and treatment of long thick painful nails .  This patient is unable to trim his own nails since the patient cannot reach the feet.  Patient says the nails are painful walking and wearing his shoes.  He also has callus heel  B/L . These calluses are painful walking and wearing his shoes.  He also has painful callus under the outside ball of his right foot.   No self treatment was afforded. He returns for preventive foot care services.  General Appearance  Alert, conversant and in no acute stress.  Vascular  Dorsalis pedis and posterior tibial  pulses are palpable  bilaterally.  Capillary return is within normal limits  bilaterally. Temperature is within normal limits  bilaterally.  Neurologic  Senn-Weinstein monofilament wire test within normal limits  bilaterally. Muscle power within normal limits bilaterally.  Nails Thick disfigured discolored nails with subungual debris  from hallux to fifth toes bilaterally. No evidence of bacterial infection or drainage bilaterally.   Orthopedic  No limitations of motion  feet .  No crepitus or effusions noted.  No bony pathology or digital deformities noted. HAV  B/L.  Hammer toes  B/L.  Skin  normotropic skin with no porokeratosis noted bilaterally.  No signs of infections or ulcers noted.   Pinch callus hallux  bilateral..  Porokeratosis sub 5th right foot.  Callus/crust  Medial and lateral aspect heels  B/L. lateral aspect heel  B/L.  Onychomycosis  Pain in toes right foot  Pain in toes left foot  Porokeratosis right foot.  Heel callus  B/L.  Debridement  of nails  1-5  B/L with a nail nipper.  Nails were then filed using a dremel tool with no incidents.    . Debride callus with # 15 blade and dremel tool..  Dispense diabetic shoes and added padding sub 5th met right foot.   RTC 9 weeks .     Kaye Luoma DPM  

## 2022-07-06 ENCOUNTER — Ambulatory Visit: Payer: 59 | Admitting: Podiatry

## 2022-07-13 ENCOUNTER — Ambulatory Visit: Payer: 59 | Admitting: Podiatry

## 2022-07-20 ENCOUNTER — Ambulatory Visit: Payer: 59 | Admitting: Podiatry

## 2022-09-19 ENCOUNTER — Other Ambulatory Visit: Payer: Self-pay | Admitting: Family

## 2022-10-04 ENCOUNTER — Other Ambulatory Visit: Payer: Self-pay | Admitting: Nurse Practitioner

## 2022-10-04 DIAGNOSIS — M542 Cervicalgia: Secondary | ICD-10-CM

## 2022-10-10 ENCOUNTER — Ambulatory Visit
Admission: RE | Admit: 2022-10-10 | Discharge: 2022-10-10 | Disposition: A | Discharge status: Home / Self Care | Payer: 59 | Source: Ambulatory Visit | Attending: Nurse Practitioner | Admitting: Nurse Practitioner

## 2022-10-10 DIAGNOSIS — R202 Paresthesia of skin: Secondary | ICD-10-CM | POA: Insufficient documentation

## 2022-10-10 DIAGNOSIS — M542 Cervicalgia: Secondary | ICD-10-CM | POA: Diagnosis not present

## 2022-10-10 DIAGNOSIS — R200 Anesthesia of skin: Secondary | ICD-10-CM | POA: Diagnosis not present

## 2022-10-27 ENCOUNTER — Telehealth: Payer: Self-pay | Admitting: *Deleted

## 2022-10-27 NOTE — Telephone Encounter (Signed)
Dennis Rowland called to verify if patient is taking Marcelline Deist still or not? Please advise.  Callback # [phone]

## 2022-10-30 ENCOUNTER — Other Ambulatory Visit: Payer: Self-pay | Admitting: Family

## 2022-11-15 ENCOUNTER — Other Ambulatory Visit: Payer: Self-pay | Admitting: Family

## 2023-01-01 ENCOUNTER — Encounter: Payer: Self-pay | Admitting: Podiatry

## 2023-01-01 ENCOUNTER — Ambulatory Visit (INDEPENDENT_AMBULATORY_CARE_PROVIDER_SITE_OTHER): Payer: 59 | Admitting: Podiatry

## 2023-01-01 ENCOUNTER — Other Ambulatory Visit: Payer: Self-pay

## 2023-01-01 DIAGNOSIS — B351 Tinea unguium: Secondary | ICD-10-CM | POA: Diagnosis not present

## 2023-01-01 DIAGNOSIS — Z794 Long term (current) use of insulin: Secondary | ICD-10-CM

## 2023-01-01 DIAGNOSIS — L84 Corns and callosities: Secondary | ICD-10-CM | POA: Diagnosis not present

## 2023-01-01 DIAGNOSIS — M79675 Pain in left toe(s): Secondary | ICD-10-CM

## 2023-01-01 DIAGNOSIS — M79674 Pain in right toe(s): Secondary | ICD-10-CM | POA: Diagnosis not present

## 2023-01-01 DIAGNOSIS — E119 Type 2 diabetes mellitus without complications: Secondary | ICD-10-CM

## 2023-01-01 DIAGNOSIS — E1142 Type 2 diabetes mellitus with diabetic polyneuropathy: Secondary | ICD-10-CM

## 2023-01-01 MED ORDER — FARXIGA 10 MG PO TABS: 10.0000 mg | ORAL_TABLET | Freq: Every day | ORAL | 3 refills | Status: DC

## 2023-01-01 NOTE — Progress Notes (Signed)
  Subjective:  Patient ID: Dennis Caul., male    DOB: 08/23/1960,  MRN: [id number]  Chief Complaint  Patient presents with   Diabetes    "Cut these toenails and shave the sides of my feet."    62 y.o. male presents with the above complaint. History confirmed with patient.  Says his diabetes is well-controlled he does get sometimes numbness and tingling.  Takes Levemir for his insulin  Objective:  Physical Exam: warm, good capillary refill, no trophic changes or ulcerative lesions, normal DP and PT pulses, and some loss of protective sensation bilateral. Left Foot: dystrophic yellowed discolored nail plates with subungual debris and callus medial hallux and lateral heel Right Foot: dystrophic yellowed discolored nail plates with subungual debris and callus medial hallux, medial heel, submetatarsal 5   Assessment:   1. Pain due to onychomycosis of toenails of both feet   2. Controlled type 2 diabetes mellitus with diabetic polyneuropathy, with long-term current use of insulin (HCC)   3. Callus of foot      Plan:  Patient was evaluated and treated and all questions answered.   Patient educated on diabetes. Discussed proper diabetic foot care and discussed risks and complications of disease. Educated patient in depth on reasons to return to the office immediately should he/she discover anything concerning or new on the feet. All questions answered. Discussed proper shoes as well.   All symptomatic hyperkeratoses were safely debrided with a sterile #15 blade to patient's level of comfort without incident. We discussed preventative and palliative care of these lesions including supportive and accommodative shoegear, padding, prefabricated and custom molded accommodative orthoses, use of a pumice stone and lotions/creams daily.  Discussed the etiology and treatment options for the condition in detail with the patient. Educated patient on the topical and oral treatment options for  mycotic nails. Recommended debridement of the nails today. Sharp and mechanical debridement performed of all painful and mycotic nails today. Nails debrided in length and thickness using a nail nipper to level of comfort. Discussed treatment options including appropriate shoe gear. Follow up as needed for painful nails.   Return in 3 months (on 04/03/2023) for at risk diabetic foot care.

## 2023-01-09 ENCOUNTER — Other Ambulatory Visit: Payer: Self-pay | Admitting: Family

## 2023-01-10 ENCOUNTER — Other Ambulatory Visit: Payer: Self-pay | Admitting: Family

## 2023-01-12 ENCOUNTER — Other Ambulatory Visit: Payer: Self-pay | Admitting: Family

## 2023-01-23 ENCOUNTER — Other Ambulatory Visit: Payer: Self-pay | Admitting: Family

## 2023-02-09 ENCOUNTER — Other Ambulatory Visit: Payer: Self-pay | Admitting: Family

## 2023-02-15 ENCOUNTER — Other Ambulatory Visit: Payer: Self-pay | Admitting: Family

## 2023-02-19 ENCOUNTER — Other Ambulatory Visit: Payer: Self-pay | Admitting: Family

## 2023-02-19 ENCOUNTER — Other Ambulatory Visit: Payer: Self-pay

## 2023-02-19 MED ORDER — LISINOPRIL 2.5 MG PO TABS: 2.5000 mg | ORAL_TABLET | Freq: Every day | ORAL | 1 refills | Status: DC

## 2023-03-28 ENCOUNTER — Ambulatory Visit: Payer: 59 | Admitting: Podiatry

## 2023-04-26 ENCOUNTER — Telehealth: Payer: Self-pay | Admitting: Podiatry

## 2023-04-26 NOTE — Telephone Encounter (Signed)
Fyi .Marland KitchenMarland KitchenPt called to check on his appt and it was scheduled for 11/13 and he was a no show. He was also a no show on 07/20/2022 and 07/06/2022 and I did explain to pt that if he no shows for the next appt he may NOT be rescheduled. He states he never got a call to remind him of the appt. I did tell him it could come up as spam on his phone and he was upset that I said that. He said he does not answer if it is spam. I did apologize and explain that the calls are not made thru our office it is thru [facility] and we have no control over it. But the appt was scheduled at his last appt and we are holding this spot for him and we have other pts trying to get in as well and when he no shows we are not able to get anyone in his appt slot. I did get him rescheduled to 05/20/2022 and repeated the appt several times for him.

## 2023-05-11 ENCOUNTER — Other Ambulatory Visit: Payer: Self-pay | Admitting: Family

## 2023-05-21 ENCOUNTER — Ambulatory Visit (INDEPENDENT_AMBULATORY_CARE_PROVIDER_SITE_OTHER): Payer: 59 | Admitting: Podiatry

## 2023-05-21 ENCOUNTER — Encounter: Payer: Self-pay | Admitting: Podiatry

## 2023-05-21 DIAGNOSIS — Z794 Long term (current) use of insulin: Secondary | ICD-10-CM | POA: Diagnosis not present

## 2023-05-21 DIAGNOSIS — M79674 Pain in right toe(s): Secondary | ICD-10-CM | POA: Diagnosis not present

## 2023-05-21 DIAGNOSIS — L84 Corns and callosities: Secondary | ICD-10-CM

## 2023-05-21 DIAGNOSIS — E1142 Type 2 diabetes mellitus with diabetic polyneuropathy: Secondary | ICD-10-CM

## 2023-05-21 DIAGNOSIS — B351 Tinea unguium: Secondary | ICD-10-CM

## 2023-05-21 DIAGNOSIS — M79675 Pain in left toe(s): Secondary | ICD-10-CM | POA: Diagnosis not present

## 2023-05-21 MED ORDER — AMMONIUM LACTATE 12 % EX CREA: 1.0000 | TOPICAL_CREAM | Freq: Two times a day (BID) | CUTANEOUS | 3 refills | Status: AC

## 2023-05-21 NOTE — Progress Notes (Signed)
  Subjective:  Patient ID: Dennis Rowland., male    DOB: 11-12-1960,  MRN: [id number]  Chief Complaint  Patient presents with   Diabetes    Corns, calluses and whatever, they hurting.    63 y.o. male presents with the above complaint. History confirmed with patient.  Says blood sugar remains well-controlled.  The calluses and nails are thickened elongated and causing pain again.  Calluses for like they are deep and causing pain on both feet. Objective:  Physical Exam: warm, good capillary refill, no trophic changes or ulcerative lesions, normal DP and PT pulses, and some loss of protective sensation bilateral. Left Foot: dystrophic yellowed discolored nail plates with subungual debris and callus medial hallux and lateral heel Right Foot: dystrophic yellowed discolored nail plates with subungual debris and callus medial hallux, lateral heel, submetatarsal 5   Assessment:   1. Pain due to onychomycosis of toenails of both feet   2. Callus of foot   3. Controlled type 2 diabetes mellitus with diabetic polyneuropathy, with long-term current use of insulin  [facility])      Plan:  Patient was evaluated and treated and all questions answered.     All symptomatic hyperkeratoses were safely debrided with a sterile #15 blade to patient's level of comfort without incident. We discussed preventative and palliative care of these lesions including supportive and accommodative shoegear, padding, prefabricated and custom molded accommodative orthoses, use of a pumice stone and lotions/creams daily.  Rx AmLactin cream sent to pharmacy use twice daily  Discussed the etiology and treatment options for the condition in detail with the patient.  Prior nail debridement was helpful in reducing pain and improving function.. Recommended debridement of the nails today. Sharp and mechanical debridement performed of all painful and mycotic nails today. Nails debrided in length and thickness using a nail nipper  to level of comfort. Discussed treatment options including appropriate shoe gear.    Return in about 12 weeks (around 08/13/2023) for at risk diabetic foot care.

## 2023-05-25 ENCOUNTER — Ambulatory Visit: Payer: 59

## 2023-05-25 DIAGNOSIS — E1142 Type 2 diabetes mellitus with diabetic polyneuropathy: Secondary | ICD-10-CM

## 2023-05-25 DIAGNOSIS — M2142 Flat foot [pes planus] (acquired), left foot: Secondary | ICD-10-CM

## 2023-05-25 DIAGNOSIS — M205X1 Other deformities of toe(s) (acquired), right foot: Secondary | ICD-10-CM

## 2023-05-25 DIAGNOSIS — L84 Corns and callosities: Secondary | ICD-10-CM

## 2023-05-25 NOTE — Progress Notes (Signed)
 Patient presents to the office today for diabetic shoe and insole measuring.  Patient was measured with brannock device to determine size and width for 1 pair of extra depth shoes and foam casted for 3 pair of insoles.   Documentation of medical necessity will be sent to patient's treating diabetic doctor to verify and sign.   Patient's diabetic provider: Fredy Sprinkles  Shoes and insoles will be ordered at that time and patient will be notified for an appointment for fitting when they arrive.   Shoe size (per patient): 9.6M Brannock measurement: 9.5 Patient shoe selection- Shoe choice:   White NB / Blk New bal Shoe size ordered: 9.6M

## 2023-06-15 ENCOUNTER — Telehealth: Payer: Self-pay | Admitting: Family

## 2023-06-15 NOTE — Telephone Encounter (Signed)
Called patient because we received a form to complete from [facility]. We cannot complete this until patient is seen for an appointment, he has not been seen since October 2023. Was unable to reach patient and VM not set up.

## 2023-06-27 ENCOUNTER — Encounter: Payer: Self-pay | Admitting: Family

## 2023-06-27 ENCOUNTER — Ambulatory Visit (INDEPENDENT_AMBULATORY_CARE_PROVIDER_SITE_OTHER): Payer: 59 | Admitting: Family

## 2023-06-27 VITALS — BP 126/76 | HR 78 | Ht 66.0 in | Wt 143.0 lb

## 2023-06-27 DIAGNOSIS — E782 Mixed hyperlipidemia: Secondary | ICD-10-CM

## 2023-06-27 DIAGNOSIS — G720 Drug-induced myopathy: Secondary | ICD-10-CM

## 2023-06-27 DIAGNOSIS — I10 Essential (primary) hypertension: Secondary | ICD-10-CM

## 2023-06-27 DIAGNOSIS — E559 Vitamin D deficiency, unspecified: Secondary | ICD-10-CM

## 2023-06-27 DIAGNOSIS — E538 Deficiency of other specified B group vitamins: Secondary | ICD-10-CM

## 2023-06-27 DIAGNOSIS — E1165 Type 2 diabetes mellitus with hyperglycemia: Secondary | ICD-10-CM | POA: Diagnosis not present

## 2023-06-27 DIAGNOSIS — R5383 Other fatigue: Secondary | ICD-10-CM | POA: Insufficient documentation

## 2023-06-27 DIAGNOSIS — Z794 Long term (current) use of insulin: Secondary | ICD-10-CM

## 2023-06-27 DIAGNOSIS — T466X5A Adverse effect of antihyperlipidemic and antiarteriosclerotic drugs, initial encounter: Secondary | ICD-10-CM

## 2023-06-27 DIAGNOSIS — E039 Hypothyroidism, unspecified: Secondary | ICD-10-CM

## 2023-06-27 NOTE — Assessment & Plan Note (Addendum)
Checking labs today. Will call pt. With results  Will complete the paperwork for his diabetic shoes and return this.  He certainly needs these based on the condition of his feet.   Continue current diabetes POC, as patient has been well controlled on current regimen.  Will adjust meds if needed based on labs.

## 2023-06-27 NOTE — Assessment & Plan Note (Signed)
Blood pressure well controlled with current medications.  Continue current therapy.  Will reassess at follow up.

## 2023-06-27 NOTE — Progress Notes (Signed)
Established Patient Office Visit  Subjective:  Patient ID: Dennis Kray., male    DOB: 02-16-1961  Age: 63 y.o. MRN: [id number]  Chief Complaint  Patient presents with   Follow-up    Patient is here today for follow up.   He has not been seen in clinic in over a year, says that he has had a lot going on.  He needs paperwork completed for his diabetic shoes.   Due for labs, but has another appointment he has to get to.   Otherwise, he has no concerns today.   Past Medical History:  Diagnosis Date   Arthritis    Chronic sniffling    Diabetes (HCC)    Dyspnea    some doe   Elevated LFTs 02/05/2019   GERD (gastroesophageal reflux disease)    Gout    Hyperlipidemia    Hypertension    Neuropathy     Past Surgical History:  Procedure Laterality Date   COLONOSCOPY N/A 10/26/2020   Procedure: COLONOSCOPY;  Surgeon: Toney Reil, MD;  Location: [facility] ENDOSCOPY;  Service: Gastroenterology;  Laterality: N/A;   COLONOSCOPY WITH PROPOFOL     NO PAST SURGERIES      Social History   Socioeconomic History   Marital status: Married    Spouse name: Not on file   Number of children: Not on file   Years of education: Not on file   Highest education level: Not on file  Occupational History   Not on file  Tobacco Use   Smoking status: Some Days    Current packs/day: 0.33    Types: Cigars, Cigarettes   Smokeless tobacco: Never  Vaping Use   Vaping status: Never Used  Substance and Sexual Activity   Alcohol use: Yes    Alcohol/week: 7.0 standard drinks of alcohol    Types: 7 Cans of beer per week    Comment: daily   Drug use: Not Currently    Types: Cocaine, Marijuana    Comment: last thursday   Sexual activity: Not on file  Other Topics Concern   Not on file  Social History Narrative   Not on file   Social Drivers of Health   Financial Resource Strain: Not on file  Food Insecurity: Not on file  Transportation Needs: Not on file  Physical Activity:  Not on file  Stress: Not on file  Social Connections: Not on file  Intimate Partner Violence: Not on file    Family History  Problem Relation Age of Onset   Hypertension Mother     Allergies  Allergen Reactions   Bee Venom Anaphylaxis   Tape Anaphylaxis   Hydrochlorothiazide W-Triamterene Other (See Comments)    Unknown Other reaction(s): Other (See Comments), Unknown Unknown   Iodinated Contrast Media     Other reaction(s): Other (See Comments) SEIZURES   Red Dye #40 (Allura Red) Nausea And Vomiting    Review of Systems  All other systems reviewed and are negative.      Objective:   BP 126/76   Pulse 78   Ht 5\' 6"  (1.676 m)   Wt 143 lb (64.9 kg)   SpO2 97%   BMI 23.08 kg/m   Vitals:   06/27/23 1127  BP: 126/76  Pulse: 78  Height: 5\' 6"  (1.676 m)  Weight: 143 lb (64.9 kg)  SpO2: 97%  BMI (Calculated): 23.09    Physical Exam Vitals and nursing note reviewed.  Constitutional:      Appearance: Normal  appearance. He is normal weight.  Eyes:     Pupils: Pupils are equal, round, and reactive to light.     Comments: Left eye prosthesis  Cardiovascular:     Rate and Rhythm: Normal rate and regular rhythm.     Pulses: Normal pulses.     Heart sounds: Normal heart sounds.  Pulmonary:     Effort: Pulmonary effort is normal.     Breath sounds: Normal breath sounds.  Musculoskeletal:     Cervical back: Full passive range of motion without pain.  Neurological:     General: No focal deficit present.     Mental Status: He is alert and oriented to person, place, and time. Mental status is at baseline.  Psychiatric:        Mood and Affect: Mood normal.        Behavior: Behavior normal.    No results found for any visits on 06/27/23.  No results found for this or any previous visit (from the past 2160 hours).     Assessment & Plan:   Problem List Items Addressed This Visit       Cardiovascular and Mediastinum   Essential hypertension, benign   Blood  pressure well controlled with current medications.  Continue current therapy.  Will reassess at follow up.        Relevant Orders   CMP14+EGFR   CBC with Diff     Endocrine   Type 2 diabetes mellitus with hyperglycemia, with long-term current use of insulin (HCC) - Primary   Checking labs today. Will call pt. With results  Will complete the paperwork for his diabetic shoes and return this.  He certainly needs these based on the condition of his feet.   Continue current diabetes POC, as patient has been well controlled on current regimen.  Will adjust meds if needed based on labs.        Relevant Medications   insulin detemir (LEVEMIR) 100 UNIT/ML injection   Other Relevant Orders   CMP14+EGFR   Hemoglobin A1c   CBC with Diff     Other   Mixed hyperlipidemia   Relevant Orders   Lipid panel   CMP14+EGFR   CBC with Diff   Other fatigue   Relevant Orders   CMP14+EGFR   CBC with Diff   Other Visit Diagnoses       B12 deficiency due to diet       Checking labs today.  Will continue supplements as needed.   Relevant Orders   CMP14+EGFR   Vitamin B12   CBC with Diff     Vitamin D deficiency, unspecified       Checking labs today.  Will continue supplements as needed.   Relevant Orders   VITAMIN D 25 Hydroxy (Vit-D Deficiency, Fractures)   CMP14+EGFR   CBC with Diff     Hypothyroidism (acquired)       Relevant Orders   CMP14+EGFR   TSH   CBC with Diff     Statin myopathy           Return in about 3 months (around 09/24/2023) for F/U.   Total time spent: 30 minutes  Miki Kins, FNP  06/27/2023   This document may have been prepared by [facility] Voice Recognition software and as such may include unintentional dictation errors.

## 2023-06-27 NOTE — Patient Instructions (Addendum)
Come in next week, on 2/19, for labs.

## 2023-07-06 ENCOUNTER — Other Ambulatory Visit: Payer: 59

## 2023-07-07 LAB — CMP14+EGFR
ALT: 26 IU/L (ref 0–44)
AST: 26 IU/L (ref 0–40)
Albumin: 5.1 g/dL — ABNORMAL HIGH (ref 3.9–4.9)
Alkaline Phosphatase: 86 IU/L (ref 44–121)
BUN/Creatinine Ratio: 17 (ref 10–24)
BUN: 29 mg/dL — ABNORMAL HIGH (ref 8–27)
Bilirubin Total: 0.5 mg/dL (ref 0.0–1.2)
CO2: 23 mmol/L (ref 20–29)
Calcium: 10.2 mg/dL (ref 8.6–10.2)
Chloride: 101 mmol/L (ref 96–106)
Creatinine, Ser: 1.73 mg/dL — ABNORMAL HIGH (ref 0.76–1.27)
Globulin, Total: 2.8 g/dL (ref 1.5–4.5)
Glucose: 107 mg/dL — ABNORMAL HIGH (ref 70–99)
Potassium: 4 mmol/L (ref 3.5–5.2)
Sodium: 141 mmol/L (ref 134–144)
Total Protein: 7.9 g/dL (ref 6.0–8.5)
eGFR: 44 mL/min/{1.73_m2} — ABNORMAL LOW (ref >59)

## 2023-07-07 LAB — CBC WITH DIFFERENTIAL/PLATELET
Basophils Absolute: 0 10*3/uL (ref 0.0–0.2)
Basos: 1 %
EOS (ABSOLUTE): 0.1 10*3/uL (ref 0.0–0.4)
Eos: 3 %
Hematocrit: 42.3 % (ref 37.5–51.0)
Hemoglobin: 14.2 g/dL (ref 13.0–17.7)
Immature Grans (Abs): 0 10*3/uL (ref 0.0–0.1)
Immature Granulocytes: 0 %
Lymphocytes Absolute: 0.9 10*3/uL (ref 0.7–3.1)
Lymphs: 18 %
MCH: 29.6 pg (ref 26.6–33.0)
MCHC: 33.6 g/dL (ref 31.5–35.7)
MCV: 88 fL (ref 79–97)
Monocytes Absolute: 0.4 10*3/uL (ref 0.1–0.9)
Monocytes: 7 %
Neutrophils Absolute: 3.8 10*3/uL (ref 1.4–7.0)
Neutrophils: 71 %
Platelets: 225 10*3/uL (ref 150–450)
RBC: 4.8 x10E6/uL (ref 4.14–5.80)
RDW: 15.2 % (ref 11.6–15.4)
WBC: 5.3 10*3/uL (ref 3.4–10.8)

## 2023-07-07 LAB — LIPID PANEL
Chol/HDL Ratio: 3.1 ratio (ref 0.0–5.0)
Cholesterol, Total: 185 mg/dL (ref 100–199)
HDL: 59 mg/dL (ref >39)
LDL Chol Calc (NIH): 107 mg/dL — ABNORMAL HIGH (ref 0–99)
Triglycerides: 109 mg/dL (ref 0–149)
VLDL Cholesterol Cal: 19 mg/dL (ref 5–40)

## 2023-07-07 LAB — HEMOGLOBIN A1C
Est. average glucose Bld gHb Est-mCnc: 120 mg/dL
Hgb A1c MFr Bld: 5.8 % — ABNORMAL HIGH (ref 4.8–5.6)

## 2023-07-07 LAB — TSH: TSH: 2.11 u[IU]/mL (ref 0.450–4.500)

## 2023-07-07 LAB — VITAMIN D 25 HYDROXY (VIT D DEFICIENCY, FRACTURES): Vit D, 25-Hydroxy: 49.7 ng/mL (ref 30.0–100.0)

## 2023-07-07 LAB — VITAMIN B12: Vitamin B-12: 1021 pg/mL (ref 232–1245)

## 2023-07-26 ENCOUNTER — Other Ambulatory Visit: Payer: Self-pay

## 2023-07-26 MED ORDER — ROSUVASTATIN CALCIUM 5 MG PO TABS: 5.0000 mg | ORAL_TABLET | Freq: Every day | ORAL | 1 refills | Status: DC

## 2023-08-09 ENCOUNTER — Other Ambulatory Visit: Payer: Self-pay | Admitting: Family

## 2023-08-09 ENCOUNTER — Telehealth: Payer: Self-pay

## 2023-08-09 NOTE — Telephone Encounter (Signed)
 Taking shoes to Dennis Rowland for his appt on 3/31. Patient stated he wanted to pick them up wioth dr [name].

## 2023-08-13 ENCOUNTER — Ambulatory Visit (INDEPENDENT_AMBULATORY_CARE_PROVIDER_SITE_OTHER): Payer: 59 | Admitting: Podiatry

## 2023-08-13 ENCOUNTER — Encounter: Payer: Self-pay | Admitting: Podiatry

## 2023-08-13 DIAGNOSIS — E1142 Type 2 diabetes mellitus with diabetic polyneuropathy: Secondary | ICD-10-CM | POA: Diagnosis not present

## 2023-08-13 DIAGNOSIS — L84 Corns and callosities: Secondary | ICD-10-CM | POA: Diagnosis not present

## 2023-08-13 DIAGNOSIS — B351 Tinea unguium: Secondary | ICD-10-CM

## 2023-08-13 DIAGNOSIS — Z794 Long term (current) use of insulin: Secondary | ICD-10-CM | POA: Diagnosis not present

## 2023-08-13 DIAGNOSIS — M79674 Pain in right toe(s): Secondary | ICD-10-CM

## 2023-08-13 DIAGNOSIS — M79675 Pain in left toe(s): Secondary | ICD-10-CM | POA: Diagnosis not present

## 2023-08-13 NOTE — Progress Notes (Signed)
  Subjective:  Patient ID: Dennis Rowland., male    DOB: [birth year],  MRN: [id number]  Chief Complaint  Patient presents with   Diabetes    [facility] diabetic shoe pick up, shoes are not here told patient I would bring them on Friday and he can pick them up    63 y.o. male presents with the above complaint. History confirmed with patient.  Says blood sugar remains well-controlled.  The calluses and nails are thickened elongated and causing pain again.  Calluses have returned and they are causing pain on both feet.  Previous debridement of both issues have been helpful in reducing pain and improving function.   Objective:  Physical Exam: warm, good capillary refill, no trophic changes or ulcerative lesions, normal DP and PT pulses, and some loss of protective sensation bilateral. Left Foot: dystrophic yellowed discolored nail plates with subungual debris and callus medial hallux and lateral heel Right Foot: dystrophic yellowed discolored nail plates with subungual debris and callus medial hallux, lateral heel, submetatarsal 5   Assessment:   1. Pain due to onychomycosis of toenails of both feet   2. Callus of foot   3. Controlled type 2 diabetes mellitus with diabetic polyneuropathy, with long-term current use of insulin (HCC)      Plan:  Patient was evaluated and treated and all questions answered.     All symptomatic hyperkeratoses were safely debrided with a sterile #15 blade to patient's level of comfort without incident. We discussed preventative and palliative care of these lesions including supportive and accommodative shoegear, padding, prefabricated and custom molded accommodative orthoses, use of a pumice stone and lotions/creams daily.  Continue using AmLactin cream  Discussed the etiology and treatment options for the condition in detail with the patient.  Prior nail debridement was helpful in reducing pain and improving function.. Recommended debridement of the nails  today. Sharp and mechanical debridement performed of all painful and mycotic nails today. Nails debrided in length and thickness using a nail nipper to level of comfort. Discussed treatment options including appropriate shoe gear.    Return in about 3 months (around 11/12/2023) for at risk diabetic foot care.

## 2023-08-14 ENCOUNTER — Telehealth: Payer: Self-pay

## 2023-08-14 NOTE — Telephone Encounter (Signed)
 Shoes are in [place]

## 2023-08-17 ENCOUNTER — Ambulatory Visit (INDEPENDENT_AMBULATORY_CARE_PROVIDER_SITE_OTHER): Admitting: Podiatry

## 2023-08-17 DIAGNOSIS — M2142 Flat foot [pes planus] (acquired), left foot: Secondary | ICD-10-CM | POA: Diagnosis not present

## 2023-08-17 DIAGNOSIS — E1142 Type 2 diabetes mellitus with diabetic polyneuropathy: Secondary | ICD-10-CM

## 2023-08-17 DIAGNOSIS — M2141 Flat foot [pes planus] (acquired), right foot: Secondary | ICD-10-CM | POA: Diagnosis not present

## 2023-08-17 DIAGNOSIS — M205X1 Other deformities of toe(s) (acquired), right foot: Secondary | ICD-10-CM

## 2023-08-17 DIAGNOSIS — L84 Corns and callosities: Secondary | ICD-10-CM

## 2023-08-17 DIAGNOSIS — Z794 Long term (current) use of insulin: Secondary | ICD-10-CM

## 2023-08-17 NOTE — Progress Notes (Signed)
 patient came in to pick up diabetic shoes, patient recieved 6 pairs of orthotic souls and one pair os shoes. tryed to collect payment but nothing was recieved at this time for collection. M.Zerah Hilyer, CCMA

## 2023-09-04 ENCOUNTER — Other Ambulatory Visit: Payer: Self-pay | Admitting: *Deleted

## 2023-09-04 ENCOUNTER — Telehealth: Payer: Self-pay | Admitting: *Deleted

## 2023-09-04 DIAGNOSIS — Z122 Encounter for screening for malignant neoplasm of respiratory organs: Secondary | ICD-10-CM

## 2023-09-04 DIAGNOSIS — F17210 Nicotine dependence, cigarettes, uncomplicated: Secondary | ICD-10-CM

## 2023-09-04 DIAGNOSIS — Z87891 Personal history of nicotine dependence: Secondary | ICD-10-CM

## 2023-09-04 NOTE — Telephone Encounter (Signed)
 Lung Cancer Screening Narrative/Criteria Questionnaire (Cigarette Smokers Only- No Cigars/Pipes/vapes)   Dennis Rowland.   SDMV:09/17/23 12:30- [name]                                           [birth year]              LDCT: 09/18/23 2:00- [facility]    63 y.o.   Phone: [phone]  Lung Screening Narrative (confirm age 55-77 yrs Medicare / 50-80 yrs Private pay insurance)   Insurance information:[employer] MCD   Referring Provider:[facility]son   This screening involves an initial phone call with a team member from our program. It is called a shared decision making visit. The initial meeting is required by insurance and Medicare to make sure you understand the program. This appointment takes about 15-20 minutes to complete. The CT scan will completed at a separate date/time. This scan takes about 5-10 minutes to complete and you may eat and drink before and after the scan.  Criteria questions for Lung Cancer Screening:   Are you a current or former smoker? Current Age began smoking: 13   If you are a former smoker, what year did you quit smoking? (within 15 yrs)   To calculate your smoking history, I need an accurate estimate of how many packs of cigarettes you smoked per day and for how many years. (Not just the number of PPD you are now smoking)   Years smoking 49 x Packs per day 1/4 - 1.5 = Pack years 49   (at least 20 pack yrs)   (Make sure they understand that we need to know how much they have smoked in the past, not just the number of PPD they are smoking now)  Do you have a personal history of cancer?  No    Do you have a family history of cancer? No  Are you coughing up blood?  No  Have you had unexplained weight loss of 15 lbs or more in the last 6 months? No  It looks like you meet all criteria.     Additional information: N/A

## 2023-09-07 ENCOUNTER — Other Ambulatory Visit: Payer: Self-pay | Admitting: Family

## 2023-09-17 ENCOUNTER — Ambulatory Visit: Admitting: Acute Care

## 2023-09-17 DIAGNOSIS — F17210 Nicotine dependence, cigarettes, uncomplicated: Secondary | ICD-10-CM

## 2023-09-17 NOTE — Progress Notes (Addendum)
 Virtual Visit via Telephone Note  I connected with [name]. on 09/17/23 at 12:30 PM EDT by telephone and verified that I am speaking with the correct person using two identifiers.  Location: Patient: Dennis Rowland. Provider: Alyse Bach, RN   I discussed the limitations, risks, security and privacy concerns of performing an evaluation and management service by telephone and the availability of in person appointments. I also discussed with the patient that there may be a patient responsible charge related to this service. The patient expressed understanding and agreed to proceed.   Shared Decision Making Visit Lung Cancer Screening Program [phone])   Eligibility: Age 63 y.o. Pack Years Smoking History Calculation 49 (# packs/per year x # years smoked) Recent History of coughing up blood  no Unexplained weight loss? no ( >Than 15 pounds within the last 6 months ) Prior History Lung / other cancer no (Diagnosis within the last 5 years already requiring surveillance chest CT Scans). Smoking Status Current Smoker Former Smokers: Years since quit: n/a  Quit Date: n/a  Visit Components: Discussion included one or more decision making aids. yes Discussion included risk/benefits of screening. yes Discussion included potential follow up diagnostic testing for abnormal scans. yes Discussion included meaning and risk of over diagnosis. yes Discussion included meaning and risk of False Positives. yes Discussion included meaning of total radiation exposure. yes  Counseling Included: Importance of adherence to annual lung cancer LDCT screening. yes Impact of comorbidities on ability to participate in the program. yes Ability and willingness to under diagnostic treatment. yes  Smoking Cessation Counseling: Current Smokers:  Discussed importance of smoking cessation. yes Information about tobacco cessation classes and interventions provided to patient. yes Patient  provided with "ticket" for LDCT Scan. no Symptomatic Patient. no  Counseling(Intermediate counseling: > three minutes) 99406 Diagnosis Code: Tobacco Use Z72.0 Asymptomatic Patient yes  Counseling (Intermediate counseling: > three minutes counseling) U0454 Former Smokers:  Discussed the importance of maintaining cigarette abstinence. yes Diagnosis Code: Personal History of Nicotine Dependence. U98.119 Information about tobacco cessation classes and interventions provided to patient. Yes Patient provided with "ticket" for LDCT Scan. no Written Order for Lung Cancer Screening with LDCT placed in Epic. Yes (CT Chest Lung Cancer Screening Low Dose W/O CM) [id number] Z12.2-Screening of respiratory organs Z87.891-Personal history of nicotine dependence   Alyse Bach, RN   I agree with the documentation of the Shared Decision Making visit,  smoking cessation counseling if appropriate, and verification or eligibility for lung cancer screening as documented by the RN Nurse Navigator.    Raejean Bullock, MSN, AGACNP-BC [facility] See Amion for personal pager PCCM on call pager [phone]

## 2023-09-17 NOTE — Patient Instructions (Signed)

## 2023-09-18 ENCOUNTER — Ambulatory Visit
Admission: RE | Admit: 2023-09-18 | Discharge: 2023-09-18 | Disposition: A | Discharge status: Home / Self Care | Source: Ambulatory Visit | Attending: Acute Care | Admitting: Acute Care

## 2023-09-18 DIAGNOSIS — F17210 Nicotine dependence, cigarettes, uncomplicated: Secondary | ICD-10-CM | POA: Insufficient documentation

## 2023-09-18 DIAGNOSIS — Z87891 Personal history of nicotine dependence: Secondary | ICD-10-CM | POA: Diagnosis present

## 2023-09-18 DIAGNOSIS — Z122 Encounter for screening for malignant neoplasm of respiratory organs: Secondary | ICD-10-CM | POA: Diagnosis present

## 2023-10-12 ENCOUNTER — Other Ambulatory Visit: Payer: Self-pay

## 2023-10-12 DIAGNOSIS — Z87891 Personal history of nicotine dependence: Secondary | ICD-10-CM

## 2023-10-12 DIAGNOSIS — Z122 Encounter for screening for malignant neoplasm of respiratory organs: Secondary | ICD-10-CM

## 2023-10-12 DIAGNOSIS — F17210 Nicotine dependence, cigarettes, uncomplicated: Secondary | ICD-10-CM

## 2023-10-15 ENCOUNTER — Other Ambulatory Visit: Payer: Self-pay

## 2023-10-17 ENCOUNTER — Ambulatory Visit (INDEPENDENT_AMBULATORY_CARE_PROVIDER_SITE_OTHER): Admitting: Podiatry

## 2023-10-17 DIAGNOSIS — B351 Tinea unguium: Secondary | ICD-10-CM

## 2023-10-17 DIAGNOSIS — M79675 Pain in left toe(s): Secondary | ICD-10-CM

## 2023-10-17 DIAGNOSIS — M79674 Pain in right toe(s): Secondary | ICD-10-CM

## 2023-10-17 DIAGNOSIS — L84 Corns and callosities: Secondary | ICD-10-CM

## 2023-10-17 DIAGNOSIS — Z794 Long term (current) use of insulin: Secondary | ICD-10-CM

## 2023-10-17 DIAGNOSIS — E1142 Type 2 diabetes mellitus with diabetic polyneuropathy: Secondary | ICD-10-CM | POA: Diagnosis not present

## 2023-10-17 NOTE — Progress Notes (Signed)
  Subjective:  Patient ID: Dennis Rowland., male    DOB: Apr 20, 1961,  MRN: [id number]  No chief complaint on file.   63 y.o. male presents with the above complaint. History confirmed with patient.  Says blood sugar remains well-controlled.  The calluses and nails are thickened elongated and causing pain again.  Calluses have returned and they are causing pain on both feet.  Previous debridement of both issues have been helpful in reducing pain and improving function.  Says he has not been able to use the cream recently due to back issues, he received his diabetic shoes and they have been comfortable   Objective:  Physical Exam: warm, good capillary refill, no trophic changes or ulcerative lesions, normal DP and PT pulses, and some loss of protective sensation bilateral. Left Foot: dystrophic yellowed discolored nail plates with subungual debris and callus medial hallux and lateral heel Right Foot: dystrophic yellowed discolored nail plates with subungual debris and callus medial hallux, lateral heel, submetatarsal 5   Assessment:   1. Pain due to onychomycosis of toenails of both feet   2. Callus of foot   3. Controlled type 2 diabetes mellitus with diabetic polyneuropathy, with long-term current use of insulin  [facility])       Plan:  Patient was evaluated and treated and all questions answered.     All symptomatic hyperkeratoses were safely debrided with a sterile #15 blade to patient's level of comfort without incident. We discussed preventative and palliative care of these lesions including supportive and accommodative shoegear, padding, prefabricated and custom molded accommodative orthoses, use of a pumice stone and lotions/creams daily.  Continue using AmLactin cream  Discussed the etiology and treatment options for the condition in detail with the patient.  Prior nail debridement was helpful in reducing pain and improving function.. Recommended debridement of the nails today.  Sharp and mechanical debridement performed of all painful and mycotic nails today. Nails debrided in length and thickness using a nail nipper to level of comfort. Discussed treatment options including appropriate shoe gear.    Return in about 3 months (around 01/17/2024) for at risk diabetic foot care.

## 2023-11-20 ENCOUNTER — Other Ambulatory Visit: Payer: Self-pay | Admitting: Family Medicine

## 2023-11-20 DIAGNOSIS — Z136 Encounter for screening for cardiovascular disorders: Secondary | ICD-10-CM

## 2023-11-28 ENCOUNTER — Ambulatory Visit
Admission: RE | Admit: 2023-11-28 | Discharge: 2023-11-28 | Disposition: A | Discharge status: Home / Self Care | Source: Ambulatory Visit | Attending: Family Medicine | Admitting: Family Medicine

## 2023-11-28 DIAGNOSIS — Z136 Encounter for screening for cardiovascular disorders: Secondary | ICD-10-CM

## 2023-12-07 ENCOUNTER — Other Ambulatory Visit: Payer: Self-pay | Admitting: Family

## 2024-01-07 ENCOUNTER — Other Ambulatory Visit: Payer: Self-pay | Admitting: Family

## 2024-01-23 ENCOUNTER — Ambulatory Visit: Admitting: Podiatry

## 2024-01-26 ENCOUNTER — Other Ambulatory Visit: Payer: Self-pay | Admitting: Family

## 2024-01-30 ENCOUNTER — Ambulatory Visit: Admitting: Podiatry

## 2024-02-06 ENCOUNTER — Other Ambulatory Visit: Payer: Self-pay | Admitting: Cardiology

## 2024-03-05 ENCOUNTER — Ambulatory Visit (INDEPENDENT_AMBULATORY_CARE_PROVIDER_SITE_OTHER): Payer: Self-pay | Admitting: Podiatry

## 2024-03-05 DIAGNOSIS — Z91199 Patient's noncompliance with other medical treatment and regimen due to unspecified reason: Secondary | ICD-10-CM

## 2024-03-06 NOTE — Progress Notes (Signed)
 Patient was no-show for appointment today.  This is his fourth no-show/same-day cancellation in the last year.  Patient will not be rescheduled with this practice

## 2024-03-27 ENCOUNTER — Ambulatory Visit
Admission: RE | Admit: 2024-03-27 | Discharge: 2024-03-27 | Disposition: A | Discharge status: Home / Self Care | Source: Ambulatory Visit | Attending: Family Medicine | Admitting: Family Medicine

## 2024-03-27 ENCOUNTER — Other Ambulatory Visit: Payer: Self-pay | Admitting: Family Medicine

## 2024-03-27 ENCOUNTER — Ambulatory Visit
Admission: RE | Admit: 2024-03-27 | Discharge: 2024-03-27 | Disposition: A | Source: Ambulatory Visit | Attending: Family Medicine | Admitting: Family Medicine

## 2024-03-27 DIAGNOSIS — M79642 Pain in left hand: Secondary | ICD-10-CM | POA: Diagnosis present

## 2024-04-02 ENCOUNTER — Other Ambulatory Visit: Payer: Self-pay | Admitting: Internal Medicine
